# Patient Record
Sex: Female | Born: 2000 | Race: White | Hispanic: Yes | Marital: Single | Smoking: Current every day smoker
Health system: Southern US, Community
[De-identification: ages and names within clinical notes are randomized; demographics above are authoritative.]

## PROBLEM LIST (undated history)

## (undated) ENCOUNTER — Inpatient Hospital Stay (HOSPITAL_COMMUNITY): Payer: Medicaid Other

## (undated) DIAGNOSIS — F909 Attention-deficit hyperactivity disorder, unspecified type: Secondary | ICD-10-CM

## (undated) DIAGNOSIS — G4700 Insomnia, unspecified: Secondary | ICD-10-CM

## (undated) DIAGNOSIS — N289 Disorder of kidney and ureter, unspecified: Secondary | ICD-10-CM

## (undated) DIAGNOSIS — F39 Unspecified mood [affective] disorder: Secondary | ICD-10-CM

## (undated) DIAGNOSIS — E119 Type 2 diabetes mellitus without complications: Secondary | ICD-10-CM

## (undated) HISTORY — DX: Insomnia, unspecified: G47.00

## (undated) HISTORY — DX: Type 2 diabetes mellitus without complications: E11.9

## (undated) HISTORY — DX: Attention-deficit hyperactivity disorder, unspecified type: F90.9

## (undated) HISTORY — PX: NO PAST SURGERIES: SHX2092

## (undated) HISTORY — DX: Unspecified mood (affective) disorder: F39

---
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
## 2019-07-06 ENCOUNTER — Other Ambulatory Visit: Payer: Self-pay

## 2019-07-06 ENCOUNTER — Emergency Department
Admission: EM | Admit: 2019-07-06 | Discharge: 2019-07-06 | Disposition: A | Discharge status: Home / Self Care | Payer: Medicaid Other | Attending: Emergency Medicine | Admitting: Emergency Medicine

## 2019-07-06 ENCOUNTER — Emergency Department: Payer: Medicaid Other

## 2019-07-06 ENCOUNTER — Encounter: Payer: Self-pay | Admitting: Emergency Medicine

## 2019-07-06 DIAGNOSIS — N83202 Unspecified ovarian cyst, left side: Secondary | ICD-10-CM | POA: Insufficient documentation

## 2019-07-06 DIAGNOSIS — R1032 Left lower quadrant pain: Secondary | ICD-10-CM | POA: Diagnosis present

## 2019-07-06 DIAGNOSIS — F909 Attention-deficit hyperactivity disorder, unspecified type: Secondary | ICD-10-CM | POA: Insufficient documentation

## 2019-07-06 DIAGNOSIS — Z79899 Other long term (current) drug therapy: Secondary | ICD-10-CM | POA: Insufficient documentation

## 2019-07-06 DIAGNOSIS — F17210 Nicotine dependence, cigarettes, uncomplicated: Secondary | ICD-10-CM | POA: Insufficient documentation

## 2019-07-06 LAB — CBC
HCT: 42.5 % (ref 36.0–46.0)
Hemoglobin: 14.1 g/dL (ref 12.0–15.0)
MCH: 30.3 pg (ref 26.0–34.0)
MCHC: 33.2 g/dL (ref 30.0–36.0)
MCV: 91.4 fL (ref 80.0–100.0)
Platelets: 226 10*3/uL (ref 150–400)
RBC: 4.65 MIL/uL (ref 3.87–5.11)
RDW: 12.3 % (ref 11.5–15.5)
WBC: 6.6 10*3/uL (ref 4.0–10.5)
nRBC: 0 % (ref 0.0–0.2)

## 2019-07-06 LAB — COMPREHENSIVE METABOLIC PANEL
ALT: 24 U/L (ref 0–44)
AST: 22 U/L (ref 15–41)
Albumin: 4.5 g/dL (ref 3.5–5.0)
Alkaline Phosphatase: 51 U/L (ref 38–126)
Anion gap: 7 (ref 5–15)
BUN: 12 mg/dL (ref 6–20)
CO2: 21 mmol/L — ABNORMAL LOW (ref 22–32)
Calcium: 9.2 mg/dL (ref 8.9–10.3)
Chloride: 109 mmol/L (ref 98–111)
Creatinine, Ser: 0.63 mg/dL (ref 0.44–1.00)
GFR calc Af Amer: >60 mL/min (ref >60)
GFR calc non Af Amer: >60 mL/min (ref >60)
Glucose, Bld: 103 mg/dL — ABNORMAL HIGH (ref 70–99)
Potassium: 4 mmol/L (ref 3.5–5.1)
Sodium: 137 mmol/L (ref 135–145)
Total Bilirubin: 0.9 mg/dL (ref 0.3–1.2)
Total Protein: 6.9 g/dL (ref 6.5–8.1)

## 2019-07-06 LAB — URINALYSIS, COMPLETE (UACMP) WITH MICROSCOPIC
Bacteria, UA: NONE SEEN
Bilirubin Urine: NEGATIVE
Glucose, UA: NEGATIVE mg/dL
Hgb urine dipstick: NEGATIVE
Ketones, ur: NEGATIVE mg/dL
Nitrite: NEGATIVE
Protein, ur: NEGATIVE mg/dL
Specific Gravity, Urine: 1.021 (ref 1.005–1.030)
pH: 6 (ref 5.0–8.0)

## 2019-07-06 LAB — WET PREP, GENITAL
Clue Cells Wet Prep HPF POC: NONE SEEN
Sperm: NONE SEEN
Trich, Wet Prep: NONE SEEN
Yeast Wet Prep HPF POC: NONE SEEN

## 2019-07-06 LAB — CHLAMYDIA/NGC RT PCR (ARMC ONLY)
Chlamydia Tr: NOT DETECTED
N gonorrhoeae: NOT DETECTED

## 2019-07-06 LAB — LIPASE, BLOOD: Lipase: 37 U/L (ref 11–51)

## 2019-07-06 LAB — POCT PREGNANCY, URINE: Preg Test, Ur: NEGATIVE

## 2019-07-06 MED ORDER — SODIUM CHLORIDE 0.9% FLUSH: 3.0000 mL | Freq: Once | INTRAVENOUS | Status: DC

## 2019-07-06 MED ORDER — NAPROXEN 500 MG PO TBEC: 500.0000 mg | DELAYED_RELEASE_TABLET | Freq: Two times a day (BID) | ORAL | 0 refills | Status: AC | PRN

## 2019-07-06 MED ORDER — NAPROXEN 500 MG PO TBEC: 500.0000 mg | DELAYED_RELEASE_TABLET | Freq: Two times a day (BID) | ORAL | 0 refills | Status: DC

## 2019-07-06 MED ORDER — ONDANSETRON 4 MG PO TBDP: 4.0000 mg | ORAL_TABLET | Freq: Three times a day (TID) | ORAL | 0 refills | Status: DC | PRN

## 2019-07-06 MED ORDER — KETOROLAC TROMETHAMINE 60 MG/2ML IM SOLN
60.0000 mg | Freq: Once | INTRAMUSCULAR | Status: AC
  Administered 2019-07-06: 60 mg via INTRAMUSCULAR
  Filled 2019-07-06: qty 2

## 2019-07-06 NOTE — ED Provider Notes (Signed)
[facility] Emergency Department Provider Note  ____________________________________________   First MD Initiated Contact with Patient 07/06/19 1608     (approximate)  I have reviewed the triage vital signs and the nursing notes.   HISTORY  Chief Complaint Back Pain    HPI Natasha Hayes is a 18 y.o. female here with left lower quadrant pain.  The patient states that monthly for the last year or so, she has had sharp, severe, primarily left lower quadrant abdominal pain.  The pain is aching, sharp, and radiates up towards her back.  She states she occasionally has some nausea with it.  No vomiting.  No diarrhea.  She states that her symptoms seem to be intermittently worsening each month, around the time of her period.  She states that symptoms did seem to begin after she started Depo shots.  Denies any fevers or chills.  No diarrhea.  She is chronically constipated but this is not new.  No vaginal bleeding or discharge.  No dyspareunia.        Past Medical History:  Diagnosis Date   ADHD    Insomnia    Mood disorder (HCC)    Renal disorder    kdiney stones    There are no active problems to display for this patient.   History reviewed. No pertinent surgical history.  Prior to Admission medications   Medication Sig Start Date End Date Taking? Authorizing Provider  cetirizine (ZYRTEC) 10 MG tablet Take 10 mg by mouth at bedtime. 04/18/19   [provider]  cholecalciferol (VITAMIN D) 1000 units tablet Take 1,000 Units by mouth daily.    [provider]  fluticasone (FLONASE) 50 MCG/ACT nasal spray Place 1 spray into both nostrils daily. 04/18/19   [provider]  lamoTRIgine (LAMICTAL) 100 MG tablet Take 100 mg by mouth daily.    [provider]  lamoTRIgine (LAMICTAL) 25 MG tablet Take 25 mg by mouth daily.    [provider]  lisdexamfetamine (VYVANSE) 40 MG capsule Take 40 mg by mouth every morning.     [provider]  Melatonin 3 MG TABS Take by mouth.    [provider]  naproxen (EC NAPROSYN) 500 MG EC tablet Take 1 tablet (500 mg total) by mouth 2 (two) times daily as needed for up to 10 days. 07/06/19 07/16/19  Shaune Pollack, MD  norelgestromin-ethinyl estradiol [employer]) 150-35 MCG/24HR transdermal patch Place 1 patch onto the skin once a week. Apply 1 patch weekly for 3 weeks, then 1 week without patch 08/25/18   Copland, Alicia B, PA-C  ondansetron (ZOFRAN ODT) 4 MG disintegrating tablet Take 1 tablet (4 mg total) by mouth every 8 (eight) hours as needed for nausea or vomiting. 07/06/19   Shaune Pollack, MD  oxyCODONE-acetaminophen (PERCOCET) 5-325 MG tablet Take 1 tablet by mouth every 4 (four) hours as needed. 12/25/18 12/25/19  Darci Current, MD    Allergies Ibuprofen  Family History  Problem Relation Age of Onset   Cancer Paternal Grandfather        lung    Social History Social History   Tobacco Use   Smoking status: Current Every Day Smoker   Smokeless tobacco: Never Used  Substance Use Topics   Alcohol use: No   Drug use: Yes    Types: Marijuana    Review of Systems  Review of Systems  Constitutional: Positive for fatigue. Negative for fever.  HENT: Negative for congestion and sore throat.  Eyes: Negative for visual disturbance.  Respiratory: Negative for cough and shortness of breath.   Cardiovascular: Negative for chest pain.  Gastrointestinal: Positive for abdominal pain and nausea. Negative for diarrhea and vomiting.  Genitourinary: Positive for pelvic pain. Negative for flank pain.  Musculoskeletal: Negative for back pain and neck pain.  Skin: Negative for rash and wound.  Neurological: Negative for weakness.  All other systems reviewed and are negative.    ____________________________________________  PHYSICAL EXAM:      VITAL SIGNS: ED Triage Vitals  Enc Vitals Group     BP 07/06/19 1410 118/70     Pulse Rate  07/06/19 1410 71     Resp 07/06/19 1410 17     Temp 07/06/19 1410 99.2 F (37.3 C)     Temp Source 07/06/19 1410 Oral     SpO2 07/06/19 1410 97 %     Weight 07/06/19 1408 139 lb 15.9 oz (63.5 kg)     Height 07/06/19 1411 5\' 3"  (1.6 m)     Head Circumference --      Peak Flow --      Pain Score 07/06/19 1406 7     Pain Loc --      Pain Edu? --      Excl. in GC? --      Physical Exam Vitals signs and nursing note reviewed.  Constitutional:      General: She is not in acute distress.    Appearance: She is well-developed.  HENT:     Head: Normocephalic and atraumatic.  Eyes:     Conjunctiva/sclera: Conjunctivae normal.  Neck:     Musculoskeletal: Neck supple.  Cardiovascular:     Rate and Rhythm: Normal rate and regular rhythm.     Heart sounds: Normal heart sounds. No murmur. No friction rub.  Pulmonary:     Effort: Pulmonary effort is normal. No respiratory distress.     Breath sounds: Normal breath sounds. No wheezing or rales.  Abdominal:     General: There is no distension.     Palpations: Abdomen is soft.     Tenderness: There is no abdominal tenderness.     Comments: Very minimal left lower quadrant and left adnexal pain.  No distention.  No guarding.  No rebound.  Genitourinary:    Comments: Small volume vaginal discharge with no cervical erythema or cervical motion tenderness.  No adnexal pain or fullness. Skin:    General: Skin is warm.     Capillary Refill: Capillary refill takes less than 2 seconds.  Neurological:     Mental Status: She is alert and oriented to person, place, and time.     Motor: No abnormal muscle tone.       ____________________________________________   LABS (all labs ordered are listed, but only abnormal results are displayed)  Labs Reviewed  WET PREP, GENITAL - Abnormal; Notable for the following components:      Result Value   WBC, Wet Prep HPF POC FEW (*)    All other components within normal limits  COMPREHENSIVE METABOLIC  PANEL - Abnormal; Notable for the following components:   CO2 21 (*)    Glucose, Bld 103 (*)    All other components within normal limits  URINALYSIS, COMPLETE (UACMP) WITH MICROSCOPIC - Abnormal; Notable for the following components:   Color, Urine YELLOW (*)    APPearance HAZY (*)    Leukocytes,Ua SMALL (*)    All other components within normal limits  CHLAMYDIA/NGC RT PCR (  [facility] ONLY)  LIPASE, BLOOD  CBC  POC URINE PREG, ED  POCT PREGNANCY, URINE    ____________________________________________  EKG: None ________________________________________  RADIOLOGY All imaging, including plain films, CT scans, and ultrasounds, independently reviewed by me, and interpretations confirmed via formal radiology reads.  ED MD interpretation:   Ultrasound: Left functional cyst noted, no torsion.  Renal ultrasound negative  Official radiology report(s): US Transvaginal Non-ob  Result Date: 07/06/2019 CLINICAL DATA:  Left lower quadrant pain, pelvic pain EXAM: TRANSABDOMINAL AND TRANSVAGINAL ULTRASOUND OF PELVIS DOPPLER ULTRASOUND OF OVARIES TECHNIQUE: Both transabdominal and transvaginal ultrasound examinations of the pelvis were performed. Transabdominal technique was performed for global imaging of the pelvis including uterus, ovaries, adnexal regions, and pelvic cul-de-sac. It was necessary to proceed with endovaginal exam following the transabdominal exam to visualize the endometrium and ovaries. Color and duplex Doppler ultrasound was utilized to evaluate blood flow to the ovaries. COMPARISON:  None. FINDINGS: Uterus Measurements: 7.8 x 3.5 x 5.0 cm = volume: 71 mL. No fibroids or other mass visualized. Endometrium Thickness: 8 mm.  No focal abnormality visualized. Right ovary Measurements: 1.7 x 1.6 x 1.8 cm = volume: 3 mL. Normal appearance/no adnexal mass. Left ovary Measurements: 2.0 x 1.8 x 1.9 cm = volume: 4 mL. Normal appearance/no adnexal mass. 1.0 cm cyst or follicle. Pulsed  Doppler evaluation of both ovaries demonstrates normal low-resistance arterial and venous waveforms. Other findings No abnormal free fluid. IMPRESSION: No ultrasound abnormality of the pelvis to explain left lower quadrant or pelvic pain. There is a 1.0 cm functional cyst or follicle of the left ovary. Electronically Signed   By: Eddie Candle M.D.   On: 07/06/2019 18:22   US Pelvis Complete  Result Date: 07/06/2019 CLINICAL DATA:  Left lower quadrant pain, pelvic pain EXAM: TRANSABDOMINAL AND TRANSVAGINAL ULTRASOUND OF PELVIS DOPPLER ULTRASOUND OF OVARIES TECHNIQUE: Both transabdominal and transvaginal ultrasound examinations of the pelvis were performed. Transabdominal technique was performed for global imaging of the pelvis including uterus, ovaries, adnexal regions, and pelvic cul-de-sac. It was necessary to proceed with endovaginal exam following the transabdominal exam to visualize the endometrium and ovaries. Color and duplex Doppler ultrasound was utilized to evaluate blood flow to the ovaries. COMPARISON:  None. FINDINGS: Uterus Measurements: 7.8 x 3.5 x 5.0 cm = volume: 71 mL. No fibroids or other mass visualized. Endometrium Thickness: 8 mm.  No focal abnormality visualized. Right ovary Measurements: 1.7 x 1.6 x 1.8 cm = volume: 3 mL. Normal appearance/no adnexal mass. Left ovary Measurements: 2.0 x 1.8 x 1.9 cm = volume: 4 mL. Normal appearance/no adnexal mass. 1.0 cm cyst or follicle. Pulsed Doppler evaluation of both ovaries demonstrates normal low-resistance arterial and venous waveforms. Other findings No abnormal free fluid. IMPRESSION: No ultrasound abnormality of the pelvis to explain left lower quadrant or pelvic pain. There is a 1.0 cm functional cyst or follicle of the left ovary. Electronically Signed   By: Eddie Candle M.D.   On: 07/06/2019 18:22   US Renal  Result Date: 07/06/2019 CLINICAL DATA:  Left flank and left lower quadrant pain. EXAM: RENAL / URINARY TRACT ULTRASOUND COMPLETE  COMPARISON:  CT, 12/25/2018. FINDINGS: Right Kidney: Renal measurements: 10.9 x 4.3 x 5.0 cm = volume: 123 mL . Echogenicity within normal limits. No mass or hydronephrosis visualized. Left Kidney: Renal measurements: 10.2 x 5.6 x 4.8 cm = volume: 146 mL. Echogenicity within normal limits. No mass or hydronephrosis visualized. Bladder: Appears normal for degree of bladder distention. IMPRESSION: Normal renal ultrasound.  No findings to account for the patient's symptoms. Electronically Signed   By: Amie Portlandavid  Ormond M.D.   On: 07/06/2019 18:24   [place] Art/ven Flow Abd Pelv Doppler  Result Date: 07/06/2019 CLINICAL DATA:  Left lower quadrant pain, pelvic pain EXAM: TRANSABDOMINAL AND TRANSVAGINAL ULTRASOUND OF PELVIS DOPPLER ULTRASOUND OF OVARIES TECHNIQUE: Both transabdominal and transvaginal ultrasound examinations of the pelvis were performed. Transabdominal technique was performed for global imaging of the pelvis including uterus, ovaries, adnexal regions, and pelvic cul-de-sac. It was necessary to proceed with endovaginal exam following the transabdominal exam to visualize the endometrium and ovaries. Color and duplex Doppler ultrasound was utilized to evaluate blood flow to the ovaries. COMPARISON:  None. FINDINGS: Uterus Measurements: 7.8 x 3.5 x 5.0 cm = volume: 71 mL. No fibroids or other mass visualized. Endometrium Thickness: 8 mm.  No focal abnormality visualized. Right ovary Measurements: 1.7 x 1.6 x 1.8 cm = volume: 3 mL. Normal appearance/no adnexal mass. Left ovary Measurements: 2.0 x 1.8 x 1.9 cm = volume: 4 mL. Normal appearance/no adnexal mass. 1.0 cm cyst or follicle. Pulsed Doppler evaluation of both ovaries demonstrates normal low-resistance arterial and venous waveforms. Other findings No abnormal free fluid. IMPRESSION: No ultrasound abnormality of the pelvis to explain left lower quadrant or pelvic pain. There is a 1.0 cm functional cyst or follicle of the left ovary. Electronically Signed   By:  Lauralyn PrimesAlex  Bibbey M.D.   On: 07/06/2019 18:22    ____________________________________________  PROCEDURES   Procedure(s) performed (including Critical Care):  Procedures  ____________________________________________  INITIAL IMPRESSION / MDM / ASSESSMENT AND PLAN / ED COURSE  As part of my medical decision making, I reviewed the following data within the electronic MEDICAL RECORD NUMBER Notes from prior ED visits and [place] Controlled Substance Database      *Natasha Hayes was evaluated in Emergency Department on 07/06/2019 for the symptoms described in the history of present illness. She was evaluated in the context of the global COVID-19 pandemic, which necessitated consideration that the patient might be at risk for infection with the SARS-CoV-2 virus that causes COVID-19. Institutional protocols and algorithms that pertain to the evaluation of patients at risk for COVID-19 are in a state of rapid change based on information released by regulatory bodies including the CDC and federal and state organizations. These policies and algorithms were followed during the patient's care in the ED.  Some ED evaluations and interventions may be delayed as a result of limited staffing during the pandemic.*      Medical Decision Making: 18 year old female here with intermittent left lower quadrant pain.  This seems to be monthly and correlates with her cycle and began after starting Depot. Suspect dysmenorrhea versus intermittent pain from ovarian cysts. No signs of torsion. UA w/o UTI and pain is not consistent with pyelo. U/S shows no hydro, and she has no hematuria or signs of stone.  Otherwise, white blood cell count is normal, LFTs normal, and she has no overt abdominal tenderness to suggest diverticulitis, colitis, appendicitis, or intra-abdominal pathology.  She does have a small cyst noted on ultrasound but no evidence of torsion as mentioned.  Will start on NSAIDs, antiemetics, and [facility] as she may  benefit from alternative birth control methods.  ____________________________________________  FINAL CLINICAL IMPRESSION(S) / ED DIAGNOSES  Final diagnoses:  Cyst of left ovary     MEDICATIONS GIVEN DURING THIS VISIT:  Medications  sodium chloride flush (NS) 0.9 % injection 3 mL (has no administration  in time range)  ketorolac (TORADOL) injection 60 mg (60 mg Intramuscular Given 07/06/19 1849)     ED Discharge Orders         Ordered    naproxen (EC NAPROSYN) 500 MG EC tablet  2 times daily with meals,   Status:  Discontinued     07/06/19 1934    ondansetron (ZOFRAN ODT) 4 MG disintegrating tablet  Every 8 hours PRN,   Status:  Discontinued     07/06/19 1934    naproxen (EC NAPROSYN) 500 MG EC tablet  2 times daily PRN     07/06/19 1935    ondansetron (ZOFRAN ODT) 4 MG disintegrating tablet  Every 8 hours PRN     07/06/19 1935           Note:  This document was prepared using Dragon voice recognition software and may include unintentional dictation errors.   Shaune PollackIsaacs, Pranish Akhavan, MD 07/06/19 2059

## 2019-07-06 NOTE — ED Notes (Signed)
ED Provider at bedside. 

## 2019-07-06 NOTE — ED Notes (Signed)
Pt reports hx of kidney infections, states pain feels the same today. States "last time I was here, the doc told me if I got one again, they'd have to go inside and blow my kidney up".

## 2019-07-06 NOTE — ED Triage Notes (Signed)
C/O back pain x 2-3 days.  C/O to low back.  Also c/o several episodes of emesis.  Patient is AAOx3.  Skin warm and dry. NAD

## 2019-07-06 NOTE — ED Notes (Signed)
Pt to US.

## 2019-08-22 ENCOUNTER — Emergency Department
Admission: EM | Admit: 2019-08-22 | Discharge: 2019-08-23 | Disposition: A | Discharge status: Home / Self Care | Payer: Medicaid Other | Attending: Emergency Medicine | Admitting: Emergency Medicine

## 2019-08-22 ENCOUNTER — Other Ambulatory Visit: Payer: Self-pay

## 2019-08-22 DIAGNOSIS — F17210 Nicotine dependence, cigarettes, uncomplicated: Secondary | ICD-10-CM | POA: Diagnosis not present

## 2019-08-22 DIAGNOSIS — Z79899 Other long term (current) drug therapy: Secondary | ICD-10-CM | POA: Insufficient documentation

## 2019-08-22 DIAGNOSIS — F909 Attention-deficit hyperactivity disorder, unspecified type: Secondary | ICD-10-CM | POA: Insufficient documentation

## 2019-08-22 DIAGNOSIS — R112 Nausea with vomiting, unspecified: Secondary | ICD-10-CM

## 2019-08-22 DIAGNOSIS — R1010 Upper abdominal pain, unspecified: Secondary | ICD-10-CM

## 2019-08-22 DIAGNOSIS — K2900 Acute gastritis without bleeding: Secondary | ICD-10-CM

## 2019-08-22 DIAGNOSIS — R109 Unspecified abdominal pain: Secondary | ICD-10-CM | POA: Diagnosis present

## 2019-08-22 DIAGNOSIS — R1011 Right upper quadrant pain: Secondary | ICD-10-CM | POA: Insufficient documentation

## 2019-08-22 LAB — POCT PREGNANCY, URINE: Preg Test, Ur: NEGATIVE

## 2019-08-22 LAB — CBC
HCT: 43.7 % (ref 36.0–46.0)
Hemoglobin: 14.5 g/dL (ref 12.0–15.0)
MCH: 29.7 pg (ref 26.0–34.0)
MCHC: 33.2 g/dL (ref 30.0–36.0)
MCV: 89.4 fL (ref 80.0–100.0)
Platelets: 223 10*3/uL (ref 150–400)
RBC: 4.89 MIL/uL (ref 3.87–5.11)
RDW: 12 % (ref 11.5–15.5)
WBC: 7.5 10*3/uL (ref 4.0–10.5)
nRBC: 0 % (ref 0.0–0.2)

## 2019-08-22 LAB — BASIC METABOLIC PANEL
Anion gap: 9 (ref 5–15)
BUN: 13 mg/dL (ref 6–20)
CO2: 23 mmol/L (ref 22–32)
Calcium: 9.5 mg/dL (ref 8.9–10.3)
Chloride: 108 mmol/L (ref 98–111)
Creatinine, Ser: 0.71 mg/dL (ref 0.44–1.00)
GFR calc Af Amer: >60 mL/min (ref >60)
GFR calc non Af Amer: >60 mL/min (ref >60)
Glucose, Bld: 96 mg/dL (ref 70–99)
Potassium: 4.2 mmol/L (ref 3.5–5.1)
Sodium: 140 mmol/L (ref 135–145)

## 2019-08-22 LAB — URINALYSIS, COMPLETE (UACMP) WITH MICROSCOPIC
Bacteria, UA: NONE SEEN
Bilirubin Urine: NEGATIVE
Glucose, UA: NEGATIVE mg/dL
Hgb urine dipstick: NEGATIVE
Ketones, ur: NEGATIVE mg/dL
Leukocytes,Ua: NEGATIVE
Nitrite: NEGATIVE
Protein, ur: NEGATIVE mg/dL
Specific Gravity, Urine: 1.018 (ref 1.005–1.030)
pH: 7 (ref 5.0–8.0)

## 2019-08-22 MED ORDER — FENTANYL CITRATE (PF) 100 MCG/2ML IJ SOLN
50.0000 ug | Freq: Once | INTRAMUSCULAR | Status: AC
  Administered 2019-08-22: 50 ug via INTRAVENOUS
  Filled 2019-08-22: qty 2

## 2019-08-22 MED ORDER — FAMOTIDINE IN NACL 20-0.9 MG/50ML-% IV SOLN
20.0000 mg | Freq: Once | INTRAVENOUS | Status: AC
  Administered 2019-08-22: 20 mg via INTRAVENOUS
  Filled 2019-08-22: qty 50

## 2019-08-22 MED ORDER — SODIUM CHLORIDE 0.9 % IV BOLUS
1000.0000 mL | Freq: Once | INTRAVENOUS | Status: AC
  Administered 2019-08-22: 1000 mL via INTRAVENOUS

## 2019-08-22 MED ORDER — ONDANSETRON HCL 4 MG/2ML IJ SOLN
4.0000 mg | Freq: Once | INTRAMUSCULAR | Status: AC
  Administered 2019-08-22: 4 mg via INTRAVENOUS
  Filled 2019-08-22: qty 2

## 2019-08-22 NOTE — ED Triage Notes (Signed)
First RN Note: Pt presents to ED via POV with c/o RUQ pain after eating something greasy. Pt states RUQ/R flank pain. Pt also c/o nausea at this time.

## 2019-08-22 NOTE — ED Notes (Signed)
Pt came to desk irate, asking about wait times, this RN explained that cannot give wait times out. Pt states that she will call her mother to take her to [facility].

## 2019-08-22 NOTE — ED Provider Notes (Signed)
[facility] Emergency Department Provider Note   ____________________________________________   First MD Initiated Contact with Patient 08/22/19 2317     (approximate)  I have reviewed the triage vital signs and the nursing notes.   HISTORY  Chief Complaint Flank Pain    HPI Natasha Hayes is a 18 y.o. female who presents to the ED from home with a chief complaint of abdominal pain.  Patient reports a 3-day history of right upper quadrant abdominal pain radiating to her right flank.  Exacerbated by eating greasy foods.  Patient ate a plate of heavy and greasy foods tonight and vomited.  Has vomited a total of 3 times in the last 3 days.  Denies fever, cough, chest pain, shortness of breath, hematuria, dysuria, diarrhea.  Denies recent travel or trauma.       Past Medical History:  Diagnosis Date  . ADHD   . Insomnia   . Mood disorder (HCC)   . Renal disorder    kdiney stones    There are no active problems to display for this patient.   No past surgical history on file.  Prior to Admission medications   Medication Sig Start Date End Date Taking? Authorizing Provider  cetirizine (ZYRTEC) 10 MG tablet Take 10 mg by mouth at bedtime. 04/18/19   [provider]  cholecalciferol (VITAMIN D) 1000 units tablet Take 1,000 Units by mouth daily.    [provider]  fluticasone (FLONASE) 50 MCG/ACT nasal spray Place 1 spray into both nostrils daily. 04/18/19   [provider]  lamoTRIgine (LAMICTAL) 100 MG tablet Take 100 mg by mouth daily.    [provider]  lamoTRIgine (LAMICTAL) 25 MG tablet Take 25 mg by mouth daily.    [provider]  lisdexamfetamine (VYVANSE) 40 MG capsule Take 40 mg by mouth every morning.    [provider]  Melatonin 3 MG TABS Take by mouth.    [provider]  norelgestromin-ethinyl estradiol Burr Medico(XULANE) 150-35 MCG/24HR transdermal patch Place 1 patch onto the skin once  a week. Apply 1 patch weekly for 3 weeks, then 1 week without patch 08/25/18   Copland, Alicia B, PA-C  ondansetron (ZOFRAN ODT) 4 MG disintegrating tablet Take 1 tablet (4 mg total) by mouth every 8 (eight) hours as needed for nausea or vomiting. 07/06/19   Shaune PollackIsaacs, Cameron, MD  oxyCODONE-acetaminophen (PERCOCET) 5-325 MG tablet Take 1 tablet by mouth every 4 (four) hours as needed. 12/25/18 12/25/19  Darci CurrentBrown, Muniz N, MD    Allergies Patient has no known allergies.  Family History  Problem Relation Age of Onset  . Cancer Paternal Grandfather        lung    Social History Social History   Tobacco Use  . Smoking status: Current Every Day Smoker  . Smokeless tobacco: Never Used  Substance Use Topics  . Alcohol use: No  . Drug use: Yes    Types: Marijuana    Review of Systems  Constitutional: No fever/chills Eyes: No visual changes. ENT: No sore throat. Cardiovascular: Denies chest pain. Respiratory: Denies shortness of breath. Gastrointestinal: Positive for abdominal pain, nausea and vomiting.  No diarrhea.  No constipation. Genitourinary: Negative for dysuria. Musculoskeletal: Negative for back pain. Skin: Negative for rash. Neurological: Negative for headaches, focal weakness or numbness.   ____________________________________________   PHYSICAL EXAM:  VITAL SIGNS: ED Triage Vitals  Enc Vitals Group     BP 08/22/19 1953 132/87     Pulse Rate 08/22/19  1953 78     Resp 08/22/19 1953 20     Temp 08/22/19 1953 98.8 F (37.1 C)     Temp Source 08/22/19 1953 Oral     SpO2 08/22/19 1953 99 %     Weight 08/22/19 1954 175 lb (79.4 kg)     Height 08/22/19 1954 5\' 3"  (1.6 m)     Head Circumference --      Peak Flow --      Pain Score 08/22/19 1954 8     Pain Loc --      Pain Edu? --      Excl. in [place]? --     Constitutional: Alert and oriented. Well appearing and in mild acute distress. Eyes: Conjunctivae are normal. PERRL. EOMI. Head: Atraumatic. Nose: No  congestion/rhinnorhea. Mouth/Throat: Mucous membranes are moist.  Oropharynx non-erythematous. Neck: No stridor.   Cardiovascular: Normal rate, regular rhythm. Grossly normal heart sounds.  Good peripheral circulation. Respiratory: Normal respiratory effort.  No retractions. Lungs CTAB. Gastrointestinal: Soft and mild to moderate tenderness to epigastrium and right upper quadrant without rebound or guarding. No distention. No abdominal bruits. No CVA tenderness. Musculoskeletal: No lower extremity tenderness nor edema.  No joint effusions. Neurologic:  Normal speech and language. No gross focal neurologic deficits are appreciated. No gait instability. Skin:  Skin is warm, dry and intact. No rash noted. Psychiatric: Mood and affect are normal. Speech and behavior are normal.  ____________________________________________   LABS (all labs ordered are listed, but only abnormal results are displayed)  Labs Reviewed  URINALYSIS, COMPLETE (UACMP) WITH MICROSCOPIC - Abnormal; Notable for the following components:      Result Value   Color, Urine YELLOW (*)    APPearance CLOUDY (*)    All other components within normal limits  BASIC METABOLIC PANEL  CBC  HEPATIC FUNCTION PANEL  LIPASE, BLOOD  POC URINE PREG, ED  POCT PREGNANCY, URINE   ____________________________________________  EKG  None ____________________________________________  RADIOLOGY  ED MD interpretation: Unremarkable right upper quadrant abdominal ultrasound; unremarkable CT scan  Official radiology report(s): Ct Abdomen Pelvis W Contrast  Result Date: 08/23/2019 CLINICAL DATA:  18 year old female with right upper quadrant abdominal pain. EXAM: CT ABDOMEN AND PELVIS WITH CONTRAST TECHNIQUE: Multidetector CT imaging of the abdomen and pelvis was performed using the standard protocol following bolus administration of intravenous contrast. CONTRAST:  155mL OMNIPAQUE IOHEXOL 300 MG/ML  SOLN COMPARISON:  CT of the abdomen  pelvis dated 12/25/2018 FINDINGS: Lower chest: The visualized lung bases are clear. No intra-abdominal free air or free fluid. Hepatobiliary: There is fatty infiltration of the liver. No intrahepatic biliary duct dilatation. The gallbladder is unremarkable. Pancreas: Unremarkable. No pancreatic ductal dilatation or surrounding inflammatory changes. Spleen: Normal in size without focal abnormality. Adrenals/Urinary Tract: Adrenal glands are unremarkable. Kidneys are normal, without renal calculi, focal lesion, or hydronephrosis. Bladder is unremarkable. Stomach/Bowel: Stomach is within normal limits. Appendix appears normal. No evidence of bowel wall thickening, distention, or inflammatory changes. Vascular/Lymphatic: No significant vascular findings are present. No enlarged abdominal or pelvic lymph nodes. Reproductive: The uterus and ovaries are grossly unremarkable. Other: None Musculoskeletal: No acute or significant osseous findings. IMPRESSION: 1. No acute intra-abdominal or pelvic pathology. 2. Fatty liver. Electronically Signed   By: Anner Crete M.D.   On: 08/23/2019 02:38   US Abdomen Limited Ruq  Result Date: 08/23/2019 CLINICAL DATA:  18 year old female with right upper quadrant abdominal pain. EXAM: ULTRASOUND ABDOMEN LIMITED RIGHT UPPER QUADRANT COMPARISON:  CT of the abdomen  pelvis dated 12/25/2018 FINDINGS: Gallbladder: No gallstones or wall thickening visualized. No sonographic Murphy sign noted by sonographer. Common bile duct: Diameter: 4 mm Liver: There is diffuse increased liver echogenicity most commonly seen with fatty infiltration. Superimposed inflammation or fibrosis is not excluded. Portal vein is patent on color Doppler imaging with normal direction of blood flow towards the liver. Other: None. IMPRESSION: Fatty liver, otherwise unremarkable right upper quadrant ultrasound. Electronically Signed   By: Elgie CollardArash  Radparvar M.D.   On: 08/23/2019 01:19     ____________________________________________   PROCEDURES  Procedure(s) performed (including Critical Care):  Procedures   ____________________________________________   INITIAL IMPRESSION / ASSESSMENT AND PLAN / ED COURSE  As part of my medical decision making, I reviewed the following data within the electronic MEDICAL RECORD NUMBER Nursing notes reviewed and incorporated, Labs reviewed, Old chart reviewed, Radiograph reviewed and Notes from prior ED visits     Natasha BenderHaley Alberico was evaluated in Emergency Department on 08/23/2019 for the symptoms described in the history of present illness. She was evaluated in the context of the global COVID-19 pandemic, which necessitated consideration that the patient might be at risk for infection with the SARS-CoV-2 virus that causes COVID-19. Institutional protocols and algorithms that pertain to the evaluation of patients at risk for COVID-19 are in a state of rapid change based on information released by regulatory bodies including the CDC and federal and state organizations. These policies and algorithms were followed during the patient's care in the ED.   18 year old female who presents with a 3-day history of upper abdominal pain associated with vomiting. Differential diagnosis includes, but is not limited to, biliary disease (biliary colic, acute cholecystitis, cholangitis, choledocholithiasis, etc), intrathoracic causes for epigastric abdominal pain including ACS, gastritis, duodenitis, pancreatitis, small bowel or large bowel obstruction, abdominal aortic aneurysm, hernia, and ulcer(s).  Patient is afebrile with normal WBC.  Will check LFTs, lipase.  Proceed with right upper quadrant abdominal ultrasound to evaluate cholecystitis.  Administer IV fluids, Fentanyl for pain, Zofran for nausea.  Clinical Course as of Aug 23 247  Tue Aug 23, 2019  0146 Updated patient on ultrasound and lab results.  Patient is hurting again and tearful.  Will re-dose IV  analgesia and proceed with CT abdomen/pelvis.   [JS]  0246 Patient soundly asleep.  Awakened patient to discuss unremarkable CT result.  Will discharge home on Pepcid, Bentyl and Zofran.  Strict return precautions given.  Patient verbalizes understanding agrees with plan of care.   [JS]    Clinical Course User Index [JS] Irean HongSung, Jade J, MD     ____________________________________________   FINAL CLINICAL IMPRESSION(S) / ED DIAGNOSES  Final diagnoses:  Pain of upper abdomen  Acute gastritis without hemorrhage, unspecified gastritis type  Non-intractable vomiting with nausea, unspecified vomiting type     ED Discharge Orders    None       Note:  This document was prepared using Dragon voice recognition software and may include unintentional dictation errors.   Irean HongSung, Jade J, MD 08/23/19 [phone]

## 2019-08-22 NOTE — ED Triage Notes (Signed)
Patient to ER for c/o pain to right flank and RLQ. Patient states she vomited today after eating something greasy. Patient has vomited x3 total in last 2-3 days. Denies seeing any blood in urine or having any dysuria.

## 2019-08-23 ENCOUNTER — Emergency Department: Payer: Medicaid Other

## 2019-08-23 ENCOUNTER — Encounter: Payer: Self-pay | Admitting: Radiology

## 2019-08-23 LAB — HEPATIC FUNCTION PANEL
ALT: 44 U/L (ref 0–44)
AST: 28 U/L (ref 15–41)
Albumin: 4.6 g/dL (ref 3.5–5.0)
Alkaline Phosphatase: 53 U/L (ref 38–126)
Bilirubin, Direct: <0.1 mg/dL (ref 0.0–0.2)
Indirect Bilirubin: UNDETERMINED mg/dL (ref 0.3–0.9)
Total Bilirubin: 0.5 mg/dL (ref 0.3–1.2)
Total Protein: 7.1 g/dL (ref 6.5–8.1)

## 2019-08-23 LAB — LIPASE, BLOOD: Lipase: 24 U/L (ref 11–51)

## 2019-08-23 MED ORDER — MORPHINE SULFATE (PF) 4 MG/ML IV SOLN
4.0000 mg | Freq: Once | INTRAVENOUS | Status: AC
  Administered 2019-08-23: 02:00:00 4 mg via INTRAVENOUS
  Filled 2019-08-23: qty 1

## 2019-08-23 MED ORDER — SODIUM CHLORIDE 0.9 % IV BOLUS
1000.0000 mL | Freq: Once | INTRAVENOUS | Status: AC
  Administered 2019-08-23: 1000 mL via INTRAVENOUS

## 2019-08-23 MED ORDER — ONDANSETRON HCL 4 MG/2ML IJ SOLN
4.0000 mg | Freq: Once | INTRAMUSCULAR | Status: AC
  Administered 2019-08-23: 02:00:00 4 mg via INTRAVENOUS
  Filled 2019-08-23: qty 2

## 2019-08-23 MED ORDER — IOHEXOL 300 MG/ML  SOLN
100.0000 mL | Freq: Once | INTRAMUSCULAR | Status: AC | PRN
  Administered 2019-08-23: 02:00:00 100 mL via INTRAVENOUS

## 2019-08-23 MED ORDER — DICYCLOMINE HCL 20 MG PO TABS: 20.0000 mg | ORAL_TABLET | Freq: Four times a day (QID) | ORAL | 0 refills | Status: DC | PRN

## 2019-08-23 MED ORDER — FAMOTIDINE 20 MG PO TABS: 20.0000 mg | ORAL_TABLET | Freq: Two times a day (BID) | ORAL | 0 refills | Status: DC

## 2019-08-23 MED ORDER — ONDANSETRON 4 MG PO TBDP: 4.0000 mg | ORAL_TABLET | Freq: Three times a day (TID) | ORAL | 0 refills | Status: DC | PRN

## 2019-08-23 MED ORDER — IOHEXOL 240 MG/ML SOLN
50.0000 mL | Freq: Once | INTRAMUSCULAR | Status: AC
  Administered 2019-08-23: 02:00:00 50 mL via ORAL

## 2019-08-23 NOTE — ED Notes (Signed)
Pt assisted to bathroom at this time. Pt with steady gait. Peri-care performed independently. Pt assisted back to bed. This RN will continue to monitor.

## 2019-08-23 NOTE — Discharge Instructions (Signed)
1.  Start Pepcid 20 mg twice daily. 2.  You may take medicines as needed for pain and nausea (Bentyl/Zofran #20). 3.  Bland diet x3 days, then slowly advance diet as tolerated. 4.  Return to the ER for worsening symptoms, persistent vomiting, difficulty breathing or other concerns.

## 2019-10-08 ENCOUNTER — Emergency Department
Admission: EM | Admit: 2019-10-08 | Discharge: 2019-10-08 | Disposition: A | Discharge status: Home / Self Care | Payer: Medicaid Other | Attending: Emergency Medicine | Admitting: Emergency Medicine

## 2019-10-08 DIAGNOSIS — Z79899 Other long term (current) drug therapy: Secondary | ICD-10-CM | POA: Insufficient documentation

## 2019-10-08 DIAGNOSIS — F17210 Nicotine dependence, cigarettes, uncomplicated: Secondary | ICD-10-CM | POA: Diagnosis not present

## 2019-10-08 DIAGNOSIS — L249 Irritant contact dermatitis, unspecified cause: Secondary | ICD-10-CM | POA: Insufficient documentation

## 2019-10-08 DIAGNOSIS — R21 Rash and other nonspecific skin eruption: Secondary | ICD-10-CM | POA: Diagnosis present

## 2019-10-08 MED ORDER — HYDROCORTISONE 1 % EX OINT
TOPICAL_OINTMENT | Freq: Two times a day (BID) | CUTANEOUS | Status: DC
  Administered 2019-10-08: 07:00:00 via TOPICAL
  Filled 2019-10-08: qty 28.35

## 2019-10-08 MED ORDER — DIPHENHYDRAMINE HCL 25 MG PO CAPS
50.0000 mg | ORAL_CAPSULE | Freq: Once | ORAL | Status: AC
  Administered 2019-10-08: 50 mg via ORAL
  Filled 2019-10-08: qty 2

## 2019-10-08 NOTE — ED Provider Notes (Signed)
[facility] Emergency Department Provider Note  ____________________________________________   First MD Initiated Contact with Patient 10/08/19 [phone]     (approximate)  I have reviewed the triage vital signs and the nursing notes.   HISTORY  Chief Complaint Rash    HPI Natasha Hayes is a 18 y.o. female with no contributory past medical history who presents for evaluation of acute onset moderate rash to the insides of both of her arms.  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  She reports that it is itching and burning.  It is only present in the middle inside part of both of her arms that seems to be starting around the inner elbow and radiating outwards.  The palms of her hands are not involved and she has no rash on her torso, neck, or face.  No lesions in her mouth.  She has no allergies of which she is aware and the detergent used at work is the same that she always uses.  She showed her manager who told her she had to stop working and come to the emergency department.   She denies shortness of breath, nausea, vomiting, abdominal pain, and diarrhea.        Past Medical History:  Diagnosis Date  . ADHD   . Insomnia   . Mood disorder (HCC)   . Renal disorder    kdiney stones    There are no active problems to display for this patient.   History reviewed. No pertinent surgical history.  Prior to Admission medications   Medication Sig Start Date End Date Taking? Authorizing Provider  cetirizine (ZYRTEC) 10 MG tablet Take 10 mg by mouth at bedtime. 04/18/19   [provider]  cholecalciferol (VITAMIN D) 1000 units tablet Take 1,000 Units by mouth daily.    [provider]  dicyclomine (BENTYL) 20 MG tablet Take 1 tablet (20 mg total) by mouth every 6 (six) hours as needed. 08/23/19   Irean HongSung, Jade J, MD  famotidine (PEPCID) 20 MG tablet Take 1 tablet (20 mg total) by mouth 2 (two) times daily. 08/23/19   Irean HongSung,  Jade J, MD  fluticasone (FLONASE) 50 MCG/ACT nasal spray Place 1 spray into both nostrils daily. 04/18/19   [provider]  lamoTRIgine (LAMICTAL) 100 MG tablet Take 100 mg by mouth daily.    [provider]  lamoTRIgine (LAMICTAL) 25 MG tablet Take 25 mg by mouth daily.    [provider]  lisdexamfetamine (VYVANSE) 40 MG capsule Take 40 mg by mouth every morning.    [provider]  Melatonin 3 MG TABS Take by mouth.    [provider]  norelgestromin-ethinyl estradiol Burr Medico(XULANE) 150-35 MCG/24HR transdermal patch Place 1 patch onto the skin once a week. Apply 1 patch weekly for 3 weeks, then 1 week without patch 08/25/18   Copland, Alicia B, PA-C  ondansetron (ZOFRAN ODT) 4 MG disintegrating tablet Take 1 tablet (4 mg total) by mouth every 8 (eight) hours as needed for nausea or vomiting. 08/23/19   Irean HongSung, Jade J, MD  oxyCODONE-acetaminophen (PERCOCET) 5-325 MG tablet Take 1 tablet by mouth every 4 (four) hours as needed. 12/25/18 12/25/19  Darci CurrentBrown, [place] N, MD    Allergies Patient has no known allergies.  Family History  Problem Relation Age of Onset  . Cancer Paternal Grandfather        lung    Social History Social History   Tobacco Use  . Smoking status:  Current Every Day Smoker  . Smokeless tobacco: Never Used  Substance Use Topics  . Alcohol use: No  . Drug use: Yes    Types: Marijuana    Review of Systems Constitutional: No fever/chills ENT: No sore throat. Cardiovascular: Denies chest pain. Respiratory: Denies shortness of breath. Gastrointestinal: No abdominal pain.  No nausea, no vomiting.  No diarrhea.  Musculoskeletal: Negative for neck pain.  Negative for back pain. Integumentary: Itching and burning rash on the insides of both of her arms. Neurological: Negative for headaches, focal weakness or numbness.   ____________________________________________   PHYSICAL EXAM:  VITAL SIGNS: ED Triage Vitals  Enc Vitals  Group     BP 10/08/19 0609 (!) 155/121     Pulse Rate 10/08/19 0609 (!) 40     Resp 10/08/19 0609 20     Temp 10/08/19 0609 98.3 F (36.8 C)     Temp Source 10/08/19 0609 Oral     SpO2 10/08/19 0609 96 %     Weight 10/08/19 0605 81.6 kg (180 lb)     Height 10/08/19 0605 1.6 m (5\' 3" )     Head Circumference --      Peak Flow --      Pain Score 10/08/19 0605 7     Pain Loc --      Pain Edu? --      Excl. in [place]? --     Constitutional: Alert and oriented.  No acute distress. Eyes: Conjunctivae are normal.  Head: Atraumatic. Mouth/Throat: Mucous membranes are moist.  No lesions inside her mouth, no mucosal involvement. Neck: No stridor.  No meningeal signs.   Cardiovascular: Normal rate, regular rhythm. Good peripheral circulation. Grossly normal heart sounds. Respiratory: Normal respiratory effort.  No retractions. Neurologic:  Normal speech and language. No gross focal neurologic deficits are appreciated.  Skin:  Skin is warm, dry and intact.  She has an erythematous and slightly raised rash on the inner part of both of her elbows, slightly worse on the right than the left, and it radiates both distally and proximally.  No involvement of the palms of her hands, and it does not extend past about the middle of her upper arm.  It appears most consistent with a contact dermatitis of unclear irritant. Psychiatric: Mood and affect are normal. Speech and behavior are normal.  ____________________________________________   LABS (all labs ordered are listed, but only abnormal results are displayed)  Labs Reviewed - No data to display ____________________________________________  EKG  No indication for EKG ____________________________________________  RADIOLOGY I, 12/08/19, personally viewed and evaluated these images (plain radiographs) as part of my medical decision making, as well as reviewing the written report by the radiologist.  ED MD interpretation: No indication for  emergent imaging  Official radiology report(s): No results found.  ____________________________________________   PROCEDURES   Procedure(s) performed (including Critical Care):  Procedures   ____________________________________________   INITIAL IMPRESSION / MDM / ASSESSMENT AND PLAN / ED COURSE  As part of my medical decision making, I reviewed the following data within the electronic MEDICAL RECORD NUMBER Nursing notes reviewed and incorporated and Notes from prior ED visits   Patient has what appears to be contact dermatitis as opposed to urticaria on the inner part of her arms.  Unclear what was the source of the irritation.  I am giving her a dose of Benadryl 50 mg by mouth and gave her hydrocortisone ointment to apply to the rash.  I encouraged her to follow-up  as an outpatient as needed and I gave the usual and customary return precautions.          ____________________________________________  FINAL CLINICAL IMPRESSION(S) / ED DIAGNOSES  Final diagnoses:  Irritant contact dermatitis, unspecified trigger     MEDICATIONS GIVEN DURING THIS VISIT:  Medications  hydrocortisone 1 % ointment ( Topical Given 10/08/19 0643)  diphenhydrAMINE (BENADRYL) capsule 50 mg (50 mg Oral Given 10/08/19 1517)     ED Discharge Orders    None      *Please note:  Natasha Hayes was evaluated in Emergency Department on 10/08/2019 for the symptoms described in the history of present illness. She was evaluated in the context of the global COVID-19 pandemic, which necessitated consideration that the patient might be at risk for infection with the SARS-CoV-2 virus that causes COVID-19. Institutional protocols and algorithms that pertain to the evaluation of patients at risk for COVID-19 are in a state of rapid change based on information released by regulatory bodies including the CDC and federal and state organizations. These policies and algorithms were followed during the patient's care  in the ED.  Some ED evaluations and interventions may be delayed as a result of limited staffing during the pandemic.*  Note:  This document was prepared using Dragon voice recognition software and may include unintentional dictation errors.   Hinda Kehr, MD 10/08/19 [phone]

## 2019-10-08 NOTE — ED Triage Notes (Signed)
Patient to ED with a rash on both arms. 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 No other areas with rash are noted.

## 2019-10-08 NOTE — Discharge Instructions (Addendum)
We do not know specifically what is causing your rash, but it does appear to be an allergic reaction rather than an infection or an indication of a more severe underlying illness.  Please take any prescribed medications as written and continue taking your normal prescription medications unless otherwise specified.  Follow up with the recommended physicians and return to the emergency department with any new or worsening symptoms that concern you, including but not limited to fever, lesions inside your mouth, etc. ° °

## 2019-12-13 NOTE — Progress Notes (Signed)
Patient, No Pcp Per   Chief Complaint  Patient presents with  . Contraception    interested in IUD    HPI:      Ms. Natasha Hayes is a 18 y.o. G0P0000 who LMP was Patient's last menstrual period was 11/21/2019 (approximate)., [facility], interested in IUD. Nexplanon in past, removed 8/19. Haven't seen pt since. States she did 1 or 2 depo injections this yr, last one due 9/20. Didn't want to continue. Now would like IUD. Still not having normal menses since stopping depo. Menses monthly, but only 2-3 days and different than usual, no BTB, mild dysmen, improved with tylenol.  She is sex active, using condoms. Occas with dyspareunia. Due for STD check--will do at IUD insertion.   Past Medical History:  Diagnosis Date  . ADHD   . Insomnia   . Mood disorder (HCC)   . Renal disorder    kdiney stones    History reviewed. No pertinent surgical history.  Family History  Problem Relation Age of Onset  . Cancer Paternal Grandfather        lung    Social History   Socioeconomic History  . Marital status: Single    Spouse name: Not on file  . Number of children: Not on file  . Years of education: Not on file  . Highest education level: Not on file  Occupational History  . Not on file  Tobacco Use  . Smoking status: Current Every Day Smoker  . Smokeless tobacco: Never Used  Substance and Sexual Activity  . Alcohol use: No  . Drug use: Yes    Types: Marijuana  . Sexual activity: Yes    Birth control/protection: None  Other Topics Concern  . Not on file  Social History Narrative  . Not on file   Social Determinants of Health   Financial Resource Strain:   . Difficulty of Paying Living Expenses: Not on file  Food Insecurity:   . Worried About Programme researcher, broadcasting/film/videounning Out of Food in the Last Year: Not on file  . Ran Out of Food in the Last Year: Not on file  Transportation Needs:   . Lack of Transportation (Medical): Not on file  . Lack of Transportation  (Non-Medical): Not on file  Physical Activity:   . Days of Exercise per Week: Not on file  . Minutes of Exercise per Session: Not on file  Stress:   . Feeling of Stress : Not on file  Social Connections:   . Frequency of Communication with Friends and Family: Not on file  . Frequency of Social Gatherings with Friends and Family: Not on file  . Attends Religious Services: Not on file  . Active Member of Clubs or Organizations: Not on file  . Attends BankerClub or Organization Meetings: Not on file  . Marital Status: Not on file  Intimate Partner Violence:   . Fear of Current or Ex-Partner: Not on file  . Emotionally Abused: Not on file  . Physically Abused: Not on file  . Sexually Abused: Not on file    Outpatient Medications Prior to Visit  Medication Sig Dispense Refill  . cetirizine (ZYRTEC) 10 MG tablet Take 10 mg by mouth at bedtime.    . cholecalciferol (VITAMIN D) 1000 units tablet Take 1,000 Units by mouth daily.    Marland Kitchen. dicyclomine (BENTYL) 20 MG tablet Take 1 tablet (20 mg total) by mouth every 6 (six) hours as needed. 20 tablet 0  . famotidine (  PEPCID) 20 MG tablet Take 1 tablet (20 mg total) by mouth 2 (two) times daily. 60 tablet 0  . fluticasone (FLONASE) 50 MCG/ACT nasal spray Place 1 spray into both nostrils daily.    Marland Kitchen lamoTRIgine (LAMICTAL) 100 MG tablet Take 100 mg by mouth daily.    Marland Kitchen lamoTRIgine (LAMICTAL) 25 MG tablet Take 25 mg by mouth daily.    Marland Kitchen lisdexamfetamine (VYVANSE) 40 MG capsule Take 40 mg by mouth every morning.    . Melatonin 3 MG TABS Take by mouth.    . norelgestromin-ethinyl estradiol [facility]) 150-35 MCG/24HR transdermal patch Place 1 patch onto the skin once a week. Apply 1 patch weekly for 3 weeks, then 1 week without patch 9 patch 3  . ondansetron (ZOFRAN ODT) 4 MG disintegrating tablet Take 1 tablet (4 mg total) by mouth every 8 (eight) hours as needed for nausea or vomiting. 20 tablet 0  . oxyCODONE-acetaminophen (PERCOCET) 5-325 MG tablet Take 1  tablet by mouth every 4 (four) hours as needed. 20 tablet 0   No facility-administered medications prior to visit.      ROS:  Review of Systems  Constitutional: Negative for fever.  Gastrointestinal: Negative for blood in stool, constipation, diarrhea, nausea and vomiting.  Genitourinary: Positive for dyspareunia and menstrual problem. Negative for dysuria, flank pain, frequency, hematuria, urgency, vaginal bleeding, vaginal discharge and vaginal pain.  Musculoskeletal: Negative for back pain.  Skin: Negative for rash.  Psychiatric/Behavioral: Positive for agitation and dysphoric mood.  BREAST: No symptoms   OBJECTIVE:   Vitals:  BP 110/70   Ht 5\' 3"  (1.6 m)   Wt 192 lb (87.1 kg)   LMP 11/21/2019 (Approximate)   BMI 34.01 kg/m   Physical Exam Vitals reviewed.  Constitutional:      Appearance: She is well-developed.  Pulmonary:     Effort: Pulmonary effort is normal.  Musculoskeletal:        General: Normal range of motion.     Cervical back: Normal range of motion.  Skin:    General: Skin is warm and dry.  Neurological:     General: No focal deficit present.     Mental Status: She is alert and oriented to person, place, and time.     Cranial Nerves: No cranial nerve deficit.  Psychiatric:        Mood and Affect: Mood normal.        Behavior: Behavior normal.        Thought Content: Thought content normal.        Judgment: Judgment normal.     Results: Results for orders placed or performed in visit on 12/14/19 (from the past 24 hour(s))  POCT urine pregnancy     Status: Normal   Collection Time: 12/14/19 11:03 AM  Result Value Ref Range   Preg Test, Ur Negative Negative     Assessment/Plan: Irregular menses - Plan: POCT urine pregnancy; Neg UPT. Normal to have irreg menses after stopping depo. Changing to IUD with next menses. Reassurance.  Encounter for initial prescription of intrauterine contraceptive device (IUD) - Plan: misoprostol (CYTOTEC) 100  MCG tablet; IUD discussed, including pros/cons/risks/benefits of [place] vs Paragard. [facility] handout given. RTO with menses for insertion/STD check. Rx cytotec/NSAIDs.   Meds ordered this encounter  Medications  . misoprostol (CYTOTEC) 100 MCG tablet    Sig: Take 1 tablet (100 mcg total) by mouth once for 1 dose. 1 hour before appt    Dispense:  1 tablet    Refill:  0    Order Specific Question:   Supervising Provider    Answer:   Gae Dry [[id number]]      Return if symptoms worsen or fail to improve.  Jowana Thumma B. Machele Deihl, PA-C 12/14/2019 2:11 PM

## 2019-12-14 ENCOUNTER — Encounter: Payer: Self-pay | Admitting: Obstetrics and Gynecology

## 2019-12-14 ENCOUNTER — Other Ambulatory Visit: Payer: Self-pay

## 2019-12-14 ENCOUNTER — Ambulatory Visit (INDEPENDENT_AMBULATORY_CARE_PROVIDER_SITE_OTHER): Payer: Medicaid Other | Admitting: Obstetrics and Gynecology

## 2019-12-14 VITALS — BP 110/70 | Ht 63.0 in | Wt 192.0 lb

## 2019-12-14 DIAGNOSIS — Z30014 Encounter for initial prescription of intrauterine contraceptive device: Secondary | ICD-10-CM | POA: Diagnosis not present

## 2019-12-14 DIAGNOSIS — Z3202 Encounter for pregnancy test, result negative: Secondary | ICD-10-CM

## 2019-12-14 DIAGNOSIS — N925 Other specified irregular menstruation: Secondary | ICD-10-CM

## 2019-12-14 DIAGNOSIS — N926 Irregular menstruation, unspecified: Secondary | ICD-10-CM

## 2019-12-14 LAB — POCT URINE PREGNANCY: Preg Test, Ur: NEGATIVE

## 2019-12-14 MED ORDER — MISOPROSTOL 100 MCG PO TABS: 100.0000 ug | ORAL_TABLET | Freq: Once | ORAL | 0 refills | Status: DC

## 2019-12-14 NOTE — Patient Instructions (Signed)
I value your feedback and entrusting us with your care. If you get a  patient survey, I would appreciate you taking the time to let us know about your experience today. Thank you!  As of December 08, 2019, your lab results will be released to your MyChart immediately, before I even have a chance to see them. Please give me time to review them and contact you if there are any abnormalities. Thank you for your patience.  

## 2019-12-21 ENCOUNTER — Telehealth: Payer: Self-pay | Admitting: Obstetrics and Gynecology

## 2019-12-21 NOTE — Telephone Encounter (Signed)
12/31 at 930 for Paragard insert with ABC

## 2019-12-27 NOTE — Telephone Encounter (Signed)
Noted. Paragard reserved for this patient. 

## 2019-12-28 NOTE — Patient Instructions (Signed)
I value your feedback and entrusting us with your care. If you get a [facility] patient survey, I would appreciate you taking the time to let us know about your experience today. Thank you!  As of December 08, 2019, your lab results will be released to your MyChart immediately, before I even have a chance to see them. Please give me time to review them and contact you if there are any abnormalities. Thank you for your patience.  

## 2019-12-28 NOTE — Progress Notes (Signed)
   Chief Complaint  Patient presents with  . Contraception    Paragard insertion      IUD PROCEDURE NOTE:  Natasha Hayes is a 18 y.o. G0P0000 here for Paragard  IUD insertion for [facility].    BP 120/82   Ht 5\' 3"  (1.6 m)   Wt 193 lb (87.5 kg)   LMP 12/21/2019 (Exact Date)   BMI 34.19 kg/m   IUD Insertion Procedure Note Patient identified, informed consent performed, consent signed.   Discussed risks of irregular bleeding, cramping, infection, malpositioning or misplacement of the IUD outside the uterus which may require further procedure such as laparoscopy, risk of failure <1%. Time out was performed.    Speculum placed in the vagina.  Cervix visualized.  Cleaned with Betadine x 2.  Grasped anteriorly with a single tooth tenaculum.  Uterus sounded to 8.0 cm.   IUD placed per manufacturer's recommendations.  Strings trimmed to 3 cm. Tenaculum was removed, good hemostasis noted.  Patient tolerated procedure well.   ASSESSMENT:  Encounter for insertion of intrauterine contraceptive device (IUD) - Plan: paragard intrauterine copper IUD  Screening for STD (sexually transmitted disease) - Plan: [place] STD   Meds ordered this encounter  Medications  . paragard intrauterine copper IUD     Plan:  Patient was given post-procedure instructions.  She was advised to have backup contraception for one week.   Call if you are having increasing pain, cramps or bleeding or if you have a fever greater than 100.4 degrees F., shaking chills, nausea or vomiting. Patient was also asked to check IUD strings periodically and follow up in 4 weeks for IUD check.  Return in about 4 weeks (around 01/26/2020) for IUD f/u.  Delshon Blanchfield B. Briley Bumgarner, PA-C 12/29/2019 12:15 PM

## 2019-12-29 ENCOUNTER — Ambulatory Visit (INDEPENDENT_AMBULATORY_CARE_PROVIDER_SITE_OTHER): Payer: Medicaid Other | Admitting: Obstetrics and Gynecology

## 2019-12-29 ENCOUNTER — Encounter: Payer: Self-pay | Admitting: Obstetrics and Gynecology

## 2019-12-29 ENCOUNTER — Other Ambulatory Visit (HOSPITAL_COMMUNITY)
Admission: RE | Admit: 2019-12-29 | Discharge: 2019-12-29 | Disposition: A | Discharge status: Home / Self Care | Payer: Medicaid Other | Source: Ambulatory Visit | Attending: Obstetrics and Gynecology | Admitting: Obstetrics and Gynecology

## 2019-12-29 ENCOUNTER — Other Ambulatory Visit: Payer: Self-pay

## 2019-12-29 VITALS — BP 120/82 | Ht 63.0 in | Wt 193.0 lb

## 2019-12-29 DIAGNOSIS — Z30430 Encounter for insertion of intrauterine contraceptive device: Secondary | ICD-10-CM | POA: Diagnosis not present

## 2019-12-29 DIAGNOSIS — Z113 Encounter for screening for infections with a predominantly sexual mode of transmission: Secondary | ICD-10-CM

## 2019-12-29 MED ORDER — PARAGARD INTRAUTERINE COPPER IU IUD: INTRAUTERINE_SYSTEM | Freq: Once | INTRAUTERINE | Status: DC

## 2020-01-02 LAB — CERVICOVAGINAL ANCILLARY ONLY
Chlamydia: NEGATIVE
Comment: NEGATIVE
Comment: NORMAL
Neisseria Gonorrhea: NEGATIVE

## 2020-01-26 ENCOUNTER — Ambulatory Visit (INDEPENDENT_AMBULATORY_CARE_PROVIDER_SITE_OTHER): Payer: Medicaid Other | Admitting: Obstetrics and Gynecology

## 2020-01-26 ENCOUNTER — Encounter: Payer: Self-pay | Admitting: Obstetrics and Gynecology

## 2020-01-26 ENCOUNTER — Other Ambulatory Visit: Payer: Self-pay

## 2020-01-26 VITALS — BP 110/90 | Ht 63.0 in | Wt 197.0 lb

## 2020-01-26 DIAGNOSIS — B9689 Other specified bacterial agents as the cause of diseases classified elsewhere: Secondary | ICD-10-CM

## 2020-01-26 DIAGNOSIS — R300 Dysuria: Secondary | ICD-10-CM | POA: Diagnosis not present

## 2020-01-26 DIAGNOSIS — N760 Acute vaginitis: Secondary | ICD-10-CM | POA: Diagnosis not present

## 2020-01-26 DIAGNOSIS — Z30431 Encounter for routine checking of intrauterine contraceptive device: Secondary | ICD-10-CM | POA: Diagnosis not present

## 2020-01-26 LAB — POCT WET PREP WITH KOH
KOH Prep POC: POSITIVE — AB
Trichomonas, UA: NEGATIVE
Yeast Wet Prep HPF POC: NEGATIVE

## 2020-01-26 LAB — POCT URINALYSIS DIPSTICK
Bilirubin, UA: NEGATIVE
Blood, UA: NEGATIVE
Glucose, UA: NEGATIVE
Ketones, UA: NEGATIVE
Leukocytes, UA: NEGATIVE
Nitrite, UA: NEGATIVE
Protein, UA: NEGATIVE
Spec Grav, UA: 1.015 (ref 1.010–1.025)
pH, UA: 6 (ref 5.0–8.0)

## 2020-01-26 MED ORDER — METRONIDAZOLE 500 MG PO TABS: 500.0000 mg | ORAL_TABLET | Freq: Two times a day (BID) | ORAL | 0 refills | Status: AC

## 2020-01-26 NOTE — Progress Notes (Signed)
Patient, No Pcp Per   Chief Complaint  Patient presents with  . IUD check  . Urinary Tract Infection    burning when urinating, a little back pain, no frequency/blood  . Vaginal Discharge    sour odor, no itchiness or irritation    HPI:      Ms. Natasha Hayes is a 19 y.o. G0P0000 who LMP was Patient's last menstrual period was 01/16/2020 (approximate)., presents today for 1 mo f/u for Paragard insertion. Pt had menses last wk. No other BTB/cramping. No dyspareunia. Has noticed increased d/c with bad odor, no irritation. Also with dysuria, mild LBP, withotu frequency/urgency, hematuria, pelvic pain. No hx of BV in past. NO new sex partners. Had neg STD testing 12/20.     There are no problems to display for this patient.   History reviewed. No pertinent surgical history.  Family History  Problem Relation Age of Onset  . Cancer Paternal Grandfather        lung    Social History   Socioeconomic History  . Marital status: Single    Spouse name: Not on file  . Number of children: Not on file  . Years of education: Not on file  . Highest education level: Not on file  Occupational History  . Not on file  Tobacco Use  . Smoking status: Current Every Day Smoker  . Smokeless tobacco: Never Used  Substance and Sexual Activity  . Alcohol use: No  . Drug use: Yes    Types: Marijuana  . Sexual activity: Yes    Birth control/protection: I.U.D.    Comment: Paragard  Other Topics Concern  . Not on file  Social History Narrative  . Not on file   Social Determinants of Health   Financial Resource Strain:   . Difficulty of Paying Living Expenses: Not on file  Food Insecurity:   . Worried About Programme researcher, broadcasting/film/video in the Last Year: Not on file  . Ran Out of Food in the Last Year: Not on file  Transportation Needs:   . Lack of Transportation (Medical): Not on file  . Lack of Transportation (Non-Medical): Not on file  Physical Activity:   . Days of Exercise per Week:  Not on file  . Minutes of Exercise per Session: Not on file  Stress:   . Feeling of Stress : Not on file  Social Connections:   . Frequency of Communication with Friends and Family: Not on file  . Frequency of Social Gatherings with Friends and Family: Not on file  . Attends Religious Services: Not on file  . Active Member of Clubs or Organizations: Not on file  . Attends Banker Meetings: Not on file  . Marital Status: Not on file  Intimate Partner Violence:   . Fear of Current or Ex-Partner: Not on file  . Emotionally Abused: Not on file  . Physically Abused: Not on file  . Sexually Abused: Not on file    Outpatient Medications Prior to Visit  Medication Sig Dispense Refill  . misoprostol (CYTOTEC) 100 MCG tablet Take 1 tablet (100 mcg total) by mouth once for 1 dose. 1 hour before appt 1 tablet 0   Facility-Administered Medications Prior to Visit  Medication Dose Route Frequency Provider Last Rate Last Admin  . paragard intrauterine copper IUD   Intrauterine Once Celestial Barnfield B, PA-C          ROS:  Review of Systems  Constitutional: Negative for fever.  Gastrointestinal: Negative for blood in stool, constipation, diarrhea, nausea and vomiting.  Genitourinary: Positive for dysuria and vaginal discharge. Negative for dyspareunia, flank pain, frequency, hematuria, urgency, vaginal bleeding and vaginal pain.  Musculoskeletal: Negative for back pain.  Skin: Negative for rash.    OBJECTIVE:   Vitals:  BP 110/90   Ht 5\' 3"  (1.6 m)   Wt 197 lb (89.4 kg)   LMP 01/16/2020 (Approximate)   BMI 34.90 kg/m   Physical Exam Vitals reviewed.  Constitutional:      Appearance: She is well-developed.  Pulmonary:     Effort: Pulmonary effort is normal.  Genitourinary:    General: Normal vulva.     Pubic Area: No rash.      Labia:        Right: No rash, tenderness or lesion.        Left: No rash, tenderness or lesion.      Vagina: Normal. No vaginal  discharge, erythema or tenderness.     Cervix: Normal.     Uterus: Normal. Not enlarged and not tender.      Adnexa: Right adnexa normal and left adnexa normal.       Right: No mass or tenderness.         Left: No mass or tenderness.       Comments: IUD STRINGS IN CX OS Musculoskeletal:        General: Normal range of motion.     Cervical back: Normal range of motion.  Skin:    General: Skin is warm and dry.  Neurological:     General: No focal deficit present.     Mental Status: She is alert and oriented to person, place, and time.  Psychiatric:        Mood and Affect: Mood normal.        Behavior: Behavior normal.        Thought Content: Thought content normal.        Judgment: Judgment normal.     Results: Results for orders placed or performed in visit on 01/26/20 (from the past 24 hour(s))  POCT Wet Prep with KOH     Status: Abnormal   Collection Time: 01/26/20  2:47 PM  Result Value Ref Range   Trichomonas, UA Negative    Clue Cells Wet Prep HPF POC few    Epithelial Wet Prep HPF POC     Yeast Wet Prep HPF POC neg    Bacteria Wet Prep HPF POC     RBC Wet Prep HPF POC     WBC Wet Prep HPF POC     KOH Prep POC Positive (A) Negative  POCT Urinalysis Dipstick     Status: Normal   Collection Time: 01/26/20  2:47 PM  Result Value Ref Range   Color, UA pale    Clarity, UA clear    Glucose, UA Negative Negative   Bilirubin, UA neg    Ketones, UA neg    Spec Grav, UA 1.015 1.010 - 1.025   Blood, UA neg    pH, UA 6.0 5.0 - 8.0   Protein, UA Negative Negative   Urobilinogen, UA     Nitrite, UA neg    Leukocytes, UA Negative Negative   Appearance     Odor       Assessment/Plan: Encounter for routine checking of intrauterine contraceptive device (IUD)--IUD strings in cx os, doing well.   Bacterial vaginosis - Plan: metroNIDAZOLE (FLAGYL) 500 MG tablet, POCT Wet Prep with KOH;  Pos sx/wet prep. Rx flagyl. No EtOH. F/u prn.   Dysuria - Plan: POCT Urinalysis  Dipstick; neg UA. Question related to BV. F/u prn.    Meds ordered this encounter  Medications  . metroNIDAZOLE (FLAGYL) 500 MG tablet    Sig: Take 1 tablet (500 mg total) by mouth 2 (two) times daily for 7 days.    Dispense:  14 tablet    Refill:  0    Order Specific Question:   Supervising Provider    Answer:   Nadara Mustard [[id number]]      Return if symptoms worsen or fail to improve.  Ezriel Boffa B. Esty Ahuja, PA-C 01/26/2020 2:49 PM

## 2020-01-26 NOTE — Patient Instructions (Signed)
I value your feedback and entrusting us with your care. If you get a [facility] patient survey, I would appreciate you taking the time to let us know about your experience today. Thank you!  As of December 08, 2019, your lab results will be released to your MyChart immediately, before I even have a chance to see them. Please give me time to review them and contact you if there are any abnormalities. Thank you for your patience.  

## 2020-02-04 ENCOUNTER — Other Ambulatory Visit: Payer: Self-pay

## 2020-02-04 ENCOUNTER — Emergency Department
Admission: EM | Admit: 2020-02-04 | Discharge: 2020-02-04 | Disposition: A | Discharge status: Home / Self Care | Payer: Medicaid Other | Attending: Emergency Medicine | Admitting: Emergency Medicine

## 2020-02-04 DIAGNOSIS — R102 Pelvic and perineal pain: Secondary | ICD-10-CM | POA: Insufficient documentation

## 2020-02-04 DIAGNOSIS — Z03823 Encounter for observation for suspected inserted (injected) foreign body ruled out: Secondary | ICD-10-CM | POA: Diagnosis not present

## 2020-02-04 DIAGNOSIS — F17200 Nicotine dependence, unspecified, uncomplicated: Secondary | ICD-10-CM | POA: Diagnosis not present

## 2020-02-04 NOTE — Discharge Instructions (Addendum)
You were seen today for possible foreign body of the vagina. There was no evidence of a retained tampon. Follow up with your GYN if you continue to have pelvic cramping.

## 2020-02-04 NOTE — ED Provider Notes (Signed)
[facility] Emergency Department Provider Note ____________________________________________  Time seen: 2115  I have reviewed the triage vital signs and the nursing notes.  HISTORY  Chief Complaint  Foreign Body   HPI Natasha Hayes is a 19 y.o. female presents to the clinic today with c/o retained foreign body in the vagina. She thinks it may be a tampon but isn't 100% sure.  She knows she put a tampon in last night but is not sure if she took it out. She is having some pelvic cramping but no abdominal pain, nausea, vomiting, vaginal discharge, odor or abnormal bleeding. She did have some bleeding after trying to retrieve the tampon by herself. She denies fever or chills.    Past Medical History:  Diagnosis Date  . ADHD   . Insomnia   . Mood disorder (HCC)   . Renal disorder    kdiney stones    There are no problems to display for this patient.   No past surgical history on file.  Prior to Admission medications   Not on File    Allergies Patient has no known allergies.  Family History  Problem Relation Age of Onset  . Cancer Paternal Grandfather        lung    Social History Social History   Tobacco Use  . Smoking status: Current Every Day Smoker  . Smokeless tobacco: Never Used  Substance Use Topics  . Alcohol use: No  . Drug use: Yes    Types: Marijuana    Review of Systems  Constitutional: Negative for fever, chills or body aches. Cardiovascular: Negative for chest tightness or chest pain. Respiratory: Negative for cough or shortness of breath. Gastrointestinal: Pt reports abdominal cramping. Negative for abdominal pain, nausea, vomiting and diarrhea. Genitourinary: Negative for urgency, frequency, dysuria or blood in urine. ____________________________________________  PHYSICAL EXAM:  VITAL SIGNS: ED Triage Vitals  Enc Vitals Group     BP 02/04/20 2039 130/79     Pulse Rate 02/04/20 2039 (!) 111     Resp 02/04/20 2039 17      Temp 02/04/20 2039 98.4 F (36.9 C)     Temp Source 02/04/20 2039 Oral     SpO2 02/04/20 2039 98 %     Weight 02/04/20 2037 197 lb (89.4 kg)     Height 02/04/20 2037 5\' 3"  (1.6 m)     Head Circumference --      Peak Flow --      Pain Score 02/04/20 2037 0     Pain Loc --      Pain Edu? --      Excl. in GC? --     Constitutional: Alert and oriented. Well appearing and in no distress. Cardiovascular: Normal rate, regular rhythm.  Respiratory: Normal respiratory effort. No wheezes/rales/rhonchi. Gastrointestinal: Soft and nontender. Pelvic: Normal female anatomy. White discharge noted in the vaginal vault. Cervix without changes. IUD strings noted. No vaginal foreign body noted. Neurologic:  Normal speech and language. No gross focal neurologic deficits are appreciated.  ____________________________________________   INITIAL IMPRESSION / ASSESSMENT AND PLAN / ED COURSE  Possible Foreign Body of Vagina, Pelvic Cramping:  No s/s of PID Exam does not reveal foreign body Offered pelvic/transvaginal ultrasound but she declines at this time Pt request discharge Advised to follow up with GYN as outpatient ____________________________________________  FINAL CLINICAL IMPRESSION(S) / ED DIAGNOSES  Final diagnoses:  Encounter for observation for suspected inserted (injected) foreign body ruled out      [place],  Coralie Keens, NP 02/04/20 2154    Carrie Mew, MD 02/04/20 2350

## 2020-02-04 NOTE — ED Triage Notes (Signed)
Thinks she has left a tampon in and forgot to take it out last night.

## 2020-03-01 ENCOUNTER — Other Ambulatory Visit: Payer: Self-pay

## 2020-03-01 ENCOUNTER — Encounter: Payer: Self-pay | Admitting: Emergency Medicine

## 2020-03-01 DIAGNOSIS — R1084 Generalized abdominal pain: Secondary | ICD-10-CM | POA: Insufficient documentation

## 2020-03-01 DIAGNOSIS — F17210 Nicotine dependence, cigarettes, uncomplicated: Secondary | ICD-10-CM | POA: Diagnosis not present

## 2020-03-01 DIAGNOSIS — F909 Attention-deficit hyperactivity disorder, unspecified type: Secondary | ICD-10-CM | POA: Insufficient documentation

## 2020-03-01 DIAGNOSIS — R112 Nausea with vomiting, unspecified: Secondary | ICD-10-CM | POA: Diagnosis not present

## 2020-03-01 LAB — CBC
HCT: 40.9 % (ref 36.0–46.0)
Hemoglobin: 13.9 g/dL (ref 12.0–15.0)
MCH: 30.8 pg (ref 26.0–34.0)
MCHC: 34 g/dL (ref 30.0–36.0)
MCV: 90.5 fL (ref 80.0–100.0)
Platelets: 212 10*3/uL (ref 150–400)
RBC: 4.52 MIL/uL (ref 3.87–5.11)
RDW: 12.2 % (ref 11.5–15.5)
WBC: 9 10*3/uL (ref 4.0–10.5)
nRBC: 0 % (ref 0.0–0.2)

## 2020-03-01 MED ORDER — SODIUM CHLORIDE 0.9% FLUSH: 3.0000 mL | Freq: Once | INTRAVENOUS | Status: DC

## 2020-03-01 NOTE — ED Triage Notes (Signed)
PT presents to ED via EMS with sharp abd pain the past couple of days; painful with palpation. EMS HR 105 BP 155/80. States she could be pregnant but is unsure. zofran IV prior to arrival. Pain located LLQ and radiates to her RUQ. Vomiting X4 today.

## 2020-03-02 ENCOUNTER — Emergency Department
Admission: EM | Admit: 2020-03-02 | Discharge: 2020-03-02 | Disposition: A | Discharge status: Home / Self Care | Payer: Medicaid Other | Attending: Emergency Medicine | Admitting: Emergency Medicine

## 2020-03-02 ENCOUNTER — Emergency Department: Payer: Medicaid Other

## 2020-03-02 DIAGNOSIS — R1084 Generalized abdominal pain: Secondary | ICD-10-CM

## 2020-03-02 DIAGNOSIS — R112 Nausea with vomiting, unspecified: Secondary | ICD-10-CM

## 2020-03-02 LAB — COMPREHENSIVE METABOLIC PANEL
ALT: 65 U/L — ABNORMAL HIGH (ref 0–44)
AST: 38 U/L (ref 15–41)
Albumin: 4.4 g/dL (ref 3.5–5.0)
Alkaline Phosphatase: 58 U/L (ref 38–126)
Anion gap: 7 (ref 5–15)
BUN: 12 mg/dL (ref 6–20)
CO2: 22 mmol/L (ref 22–32)
Calcium: 9 mg/dL (ref 8.9–10.3)
Chloride: 108 mmol/L (ref 98–111)
Creatinine, Ser: 0.73 mg/dL (ref 0.44–1.00)
GFR calc Af Amer: >60 mL/min (ref >60)
GFR calc non Af Amer: >60 mL/min (ref >60)
Glucose, Bld: 99 mg/dL (ref 70–99)
Potassium: 3.3 mmol/L — ABNORMAL LOW (ref 3.5–5.1)
Sodium: 137 mmol/L (ref 135–145)
Total Bilirubin: 0.5 mg/dL (ref 0.3–1.2)
Total Protein: 7 g/dL (ref 6.5–8.1)

## 2020-03-02 LAB — URINALYSIS, COMPLETE (UACMP) WITH MICROSCOPIC
Bacteria, UA: NONE SEEN
Bilirubin Urine: NEGATIVE
Glucose, UA: NEGATIVE mg/dL
Hgb urine dipstick: NEGATIVE
Leukocytes,Ua: NEGATIVE
Nitrite: NEGATIVE
Protein, ur: NEGATIVE mg/dL
RBC / HPF: NONE SEEN RBC/hpf (ref 0–5)
Specific Gravity, Urine: 1.025 (ref 1.005–1.030)
pH: 6.5 (ref 5.0–8.0)

## 2020-03-02 LAB — LIPASE, BLOOD: Lipase: 23 U/L (ref 11–51)

## 2020-03-02 LAB — POCT PREGNANCY, URINE: Preg Test, Ur: NEGATIVE

## 2020-03-02 MED ORDER — IOHEXOL 300 MG/ML  SOLN
100.0000 mL | Freq: Once | INTRAMUSCULAR | Status: AC | PRN
  Administered 2020-03-02: 100 mL via INTRAVENOUS

## 2020-03-02 MED ORDER — SODIUM CHLORIDE 0.9 % IV BOLUS
1000.0000 mL | Freq: Once | INTRAVENOUS | Status: AC
  Administered 2020-03-02: 1000 mL via INTRAVENOUS

## 2020-03-02 MED ORDER — DICYCLOMINE HCL 20 MG PO TABS: 20.0000 mg | ORAL_TABLET | Freq: Four times a day (QID) | ORAL | 0 refills | Status: DC

## 2020-03-02 MED ORDER — ONDANSETRON HCL 4 MG/2ML IJ SOLN
4.0000 mg | Freq: Once | INTRAMUSCULAR | Status: AC
  Administered 2020-03-02: 4 mg via INTRAVENOUS
  Filled 2020-03-02: qty 2

## 2020-03-02 MED ORDER — FENTANYL CITRATE (PF) 100 MCG/2ML IJ SOLN
50.0000 ug | Freq: Once | INTRAMUSCULAR | Status: AC
  Administered 2020-03-02: 50 ug via INTRAVENOUS
  Filled 2020-03-02: qty 2

## 2020-03-02 MED ORDER — ONDANSETRON 4 MG PO TBDP: 4.0000 mg | ORAL_TABLET | Freq: Three times a day (TID) | ORAL | 0 refills | Status: DC | PRN

## 2020-03-02 MED ORDER — IOHEXOL 9 MG/ML PO SOLN
500.0000 mL | ORAL | Status: AC
  Administered 2020-03-02 (×2): 500 mL via ORAL

## 2020-03-02 NOTE — ED Provider Notes (Signed)
[facility] Emergency Department Provider Note   ____________________________________________   First MD Initiated Contact with Patient 03/02/20 [phone]     (approximate)  I have reviewed the triage vital signs and the nursing notes.   HISTORY  Chief Complaint Abdominal Pain    HPI Natasha Hayes is a 19 y.o. female brought to the ED via EMS from home with a chief complaint of abdominal pain.  Patient reports a 2-day history of sharp abdominal pain which began in her left lower quadrant, now radiating to her right upper quadrant.  Nausea/vomiting x4 today.  Denies associated fever, cough, chest pain, shortness of breath, dysuria or diarrhea.       Past Medical History:  Diagnosis Date  . ADHD   . Insomnia   . Mood disorder ([place])   . Renal disorder    kdiney stones    There are no problems to display for this patient.   History reviewed. No pertinent surgical history.  Prior to Admission medications   Medication Sig Start Date End Date Taking? Authorizing Provider  dicyclomine (BENTYL) 20 MG tablet Take 1 tablet (20 mg total) by mouth every 6 (six) hours. 03/02/20   Paulette Blanch, MD  ondansetron (ZOFRAN ODT) 4 MG disintegrating tablet Take 1 tablet (4 mg total) by mouth every 8 (eight) hours as needed for nausea or vomiting. 03/02/20   Paulette Blanch, MD    Allergies Patient has no known allergies.  Family History  Problem Relation Age of Onset  . Cancer Paternal Grandfather        lung    Social History Social History   Tobacco Use  . Smoking status: Current Every Day Smoker  . Smokeless tobacco: Never Used  Substance Use Topics  . Alcohol use: Yes  . Drug use: Yes    Types: Marijuana    Review of Systems  Constitutional: No fever/chills Eyes: No visual changes. ENT: No sore throat. Cardiovascular: Denies chest pain. Respiratory: Denies shortness of breath. Gastrointestinal: Positive for abdominal pain, nausea and vomiting.  No  diarrhea.  No constipation. Genitourinary: Negative for dysuria. Musculoskeletal: Negative for back pain. Skin: Negative for rash. Neurological: Negative for headaches, focal weakness or numbness.   ____________________________________________   PHYSICAL EXAM:  VITAL SIGNS: ED Triage Vitals  Enc Vitals Group     BP 03/01/20 2333 134/77     Pulse Rate 03/01/20 2333 89     Resp 03/01/20 2333 18     Temp 03/01/20 2333 98.7 F (37.1 C)     Temp Source 03/01/20 2333 Oral     SpO2 03/01/20 2333 97 %     Weight 03/01/20 2334 192 lb (87.1 kg)     Height 03/01/20 2334 5\' 3"  (1.6 m)     Head Circumference --      Peak Flow --      Pain Score --      Pain Loc --      Pain Edu? --      Excl. in [place]? --     Constitutional: Alert and oriented. Well appearing and in no acute distress. Eyes: Conjunctivae are normal. PERRL. EOMI. Head: Atraumatic. Nose: No congestion/rhinnorhea. Mouth/Throat: Mucous membranes are moist.  Oropharynx non-erythematous. Neck: No stridor.   Cardiovascular: Normal rate, regular rhythm. Grossly normal heart sounds.  Good peripheral circulation. Respiratory: Normal respiratory effort.  No retractions. Lungs CTAB. Gastrointestinal: Soft and nontender. No distention. No abdominal bruits. No CVA tenderness. Musculoskeletal: No lower extremity tenderness  nor edema.  No joint effusions. Neurologic:  Normal speech and language. No gross focal neurologic deficits are appreciated. No gait instability. Skin:  Skin is warm, dry and intact. No rash noted. Psychiatric: Mood and affect are normal. Speech and behavior are normal.  ____________________________________________   LABS (all labs ordered are listed, but only abnormal results are displayed)  Labs Reviewed  COMPREHENSIVE METABOLIC PANEL - Abnormal; Notable for the following components:      Result Value   Potassium 3.3 (*)    ALT 65 (*)    All other components within normal limits  URINALYSIS, COMPLETE  (UACMP) WITH MICROSCOPIC - Abnormal; Notable for the following components:   Ketones, ur TRACE (*)    All other components within normal limits  CBC  LIPASE, BLOOD  POC URINE PREG, ED  POCT PREGNANCY, URINE   ____________________________________________  EKG  None ____________________________________________  RADIOLOGY  ED MD interpretation: No acute abnormality in the abdomen/pelvis  Official radiology report(s): CT Abdomen Pelvis W Contrast  Result Date: 03/02/2020 CLINICAL DATA:  Abdominal pain EXAM: CT ABDOMEN AND PELVIS WITH CONTRAST TECHNIQUE: Multidetector CT imaging of the abdomen and pelvis was performed using the standard protocol following bolus administration of intravenous contrast. CONTRAST:  OMNIPAQUE IOHEXOL 300 MG/ML  SOLN COMPARISON:  August 23, 2019 FINDINGS: Lower chest: The visualized heart size within normal limits. No pericardial fluid/thickening. No hiatal hernia. The visualized portions of the lungs are clear. Hepatobiliary: Multifocal heterogeneous areas of fatty infiltration are seen throughout the liver parenchyma.The main portal vein is patent. No evidence of calcified gallstones, gallbladder wall thickening or biliary dilatation. Pancreas: Unremarkable. No pancreatic ductal dilatation or surrounding inflammatory changes. Spleen: Normal in size without focal abnormality. Adrenals/Urinary Tract: Both adrenal glands appear normal. The kidneys and collecting system appear normal without evidence of urinary tract calculus or hydronephrosis. Bladder is unremarkable. Stomach/Bowel: The stomach, small bowel, and colon are normal in appearance. No inflammatory changes, wall thickening, or obstructive findings.The appendix is normal. Vascular/Lymphatic: There are no enlarged mesenteric, retroperitoneal, or pelvic lymph nodes. No significant vascular findings are present. Reproductive: IUD seen within the upper endometrial canal. Other: No evidence of abdominal wall mass  or hernia. Musculoskeletal: No acute or significant osseous findings. IMPRESSION: Heterogeneous area of focal fatty infiltration slightly more prominent than the prior exam. No acute intra-abdominal or pelvic pathology to explain the patient's symptoms. Electronically Signed   By: Jonna Clark M.D.   On: 03/02/2020 04:10    ____________________________________________   PROCEDURES  Procedure(s) performed (including Critical Care):  Procedures   ____________________________________________   INITIAL IMPRESSION / ASSESSMENT AND PLAN / ED COURSE  As part of my medical decision making, I reviewed the following data within the electronic MEDICAL RECORD NUMBER Nursing notes reviewed and incorporated, Labs reviewed, Old chart reviewed, Radiograph reviewed and Notes from prior ED visits     Natasha Hayes was evaluated in Emergency Department on 03/02/2020 for the symptoms described in the history of present illness. She was evaluated in the context of the global COVID-19 pandemic, which necessitated consideration that the patient might be at risk for infection with the SARS-CoV-2 virus that causes COVID-19. Institutional protocols and algorithms that pertain to the evaluation of patients at risk for COVID-19 are in a state of rapid change based on information released by regulatory bodies including the CDC and federal and state organizations. These policies and algorithms were followed during the patient's care in the ED.    19 year old female who presents with abdominal  pain, nausea and vomiting. Differential diagnosis includes, but is not limited to, ovarian cyst, ovarian torsion, acute appendicitis, diverticulitis, urinary tract infection/pyelonephritis, endometriosis, bowel obstruction, colitis, renal colic, gastroenteritis, hernia, fibroids, endometriosis, pregnancy related pain including ectopic pregnancy, etc.  Laboratory and urinalysis results unremarkable.  Will proceed to CT abdomen/ pelvis to  evaluate for intra-abdominal etiology of patient's symptoms.  Administer IV analgesia and IV antiemetic in addition to IV fluids.   Clinical Course as of Mar 03 603  Fri Mar 02, 2020  0430 Patient resting in no acute distress.  Updated her on CT imaging result.  Strict return precautions given.  Patient verbalizes understanding and agrees with plan of care.   [JS]    Clinical Course User Index [JS] Irean Hong, MD     ____________________________________________   FINAL CLINICAL IMPRESSION(S) / ED DIAGNOSES  Final diagnoses:  Generalized abdominal pain  Non-intractable vomiting with nausea, unspecified vomiting type     ED Discharge Orders         Ordered    dicyclomine (BENTYL) 20 MG tablet  Every 6 hours     03/02/20 0432    ondansetron (ZOFRAN ODT) 4 MG disintegrating tablet  Every 8 hours PRN     03/02/20 0432           Note:  This document was prepared using Dragon voice recognition software and may include unintentional dictation errors.   Irean Hong, MD 03/02/20 [phone]

## 2020-03-02 NOTE — Discharge Instructions (Signed)
1.  You may take medicines as needed for abdominal cramps and nausea (Bentyl/Zofran #20). 2.  Clear liquids x12 hours, then bland diet x2 days, then slowly advance diet as tolerated. 3.  Return to the ER for worsening symptoms, persistent vomiting, difficulty breathing or other concerns.

## 2020-03-13 ENCOUNTER — Ambulatory Visit (INDEPENDENT_AMBULATORY_CARE_PROVIDER_SITE_OTHER): Payer: Medicaid Other | Admitting: Obstetrics and Gynecology

## 2020-03-13 ENCOUNTER — Other Ambulatory Visit (HOSPITAL_COMMUNITY)
Admission: RE | Admit: 2020-03-13 | Discharge: 2020-03-13 | Disposition: A | Discharge status: Home / Self Care | Payer: Medicaid Other | Source: Ambulatory Visit | Attending: Obstetrics and Gynecology | Admitting: Obstetrics and Gynecology

## 2020-03-13 ENCOUNTER — Encounter: Payer: Self-pay | Admitting: Obstetrics and Gynecology

## 2020-03-13 ENCOUNTER — Other Ambulatory Visit: Payer: Self-pay

## 2020-03-13 VITALS — BP 100/70 | Ht 63.0 in | Wt 192.0 lb

## 2020-03-13 DIAGNOSIS — Z30432 Encounter for removal of intrauterine contraceptive device: Secondary | ICD-10-CM

## 2020-03-13 DIAGNOSIS — Z3202 Encounter for pregnancy test, result negative: Secondary | ICD-10-CM | POA: Diagnosis not present

## 2020-03-13 DIAGNOSIS — Z113 Encounter for screening for infections with a predominantly sexual mode of transmission: Secondary | ICD-10-CM | POA: Insufficient documentation

## 2020-03-13 DIAGNOSIS — R102 Pelvic and perineal pain: Secondary | ICD-10-CM

## 2020-03-13 DIAGNOSIS — N938 Other specified abnormal uterine and vaginal bleeding: Secondary | ICD-10-CM | POA: Diagnosis not present

## 2020-03-13 DIAGNOSIS — Z30015 Encounter for initial prescription of vaginal ring hormonal contraceptive: Secondary | ICD-10-CM

## 2020-03-13 LAB — POCT URINE PREGNANCY: Preg Test, Ur: NEGATIVE

## 2020-03-13 MED ORDER — ETONOGESTREL-ETHINYL ESTRADIOL 0.12-0.015 MG/24HR VA RING: VAGINAL_RING | VAGINAL | 3 refills | Status: DC

## 2020-03-13 NOTE — Progress Notes (Signed)
Chief Complaint  Patient presents with  . Contraception    IUD removal due to a lot of pelvic pain, 2 positive UPT's at home, would like an in office UPT     History of Present Illness:  Natasha Hayes is a 19 y.o. that had a Paragard IUD placed approximately 3 months ago. Since that time, she has been having pelvic pains and irregular bleeding. She is sex active, with some dyspareunia. No new partners. Went to ED 03/02/20 due to abd/LLQ pain with neg CT scan/IUD in correct location. Pt with increased constipation recently. Has had nexplanon and depo in past with side effects. Would like to try nuvaring. No hx of HTN, DVTs, migraines.   Had 2 pos UPTs at home with neg UPT 03/02/20 in ED.    Past Medical History:  Diagnosis Date  . ADHD   . Insomnia   . Mood disorder ([facility])   . Renal disorder    kdiney stones    History reviewed. No pertinent surgical history.  Family History  Problem Relation Age of Onset  . Cancer Paternal Grandfather        lung    Social History   Socioeconomic History  . Marital status: Single    Spouse name: Not on file  . Number of children: Not on file  . Years of education: Not on file  . Highest education level: Not on file  Occupational History  . Not on file  Tobacco Use  . Smoking status: Current Every Day Smoker  . Smokeless tobacco: Never Used  Substance and Sexual Activity  . Alcohol use: Yes  . Drug use: Yes    Types: Marijuana  . Sexual activity: Yes    Birth control/protection: I.U.D.    Comment: Paragard  Other Topics Concern  . Not on file  Social History Narrative  . Not on file   Social Determinants of Health   Financial Resource Strain:   . Difficulty of Paying Living Expenses:   Food Insecurity:   . Worried About Programme researcher, broadcasting/film/video in the Last Year:   . Barista in the Last Year:   Transportation Needs:   . Freight forwarder (Medical):   Marland Kitchen Lack of Transportation (Non-Medical):   Physical Activity:   .  Days of Exercise per Week:   . Minutes of Exercise per Session:   Stress:   . Feeling of Stress :   Social Connections:   . Frequency of Communication with Friends and Family:   . Frequency of Social Gatherings with Friends and Family:   . Attends Religious Services:   . Active Member of Clubs or Organizations:   . Attends Banker Meetings:   Marland Kitchen Marital Status:   Intimate Partner Violence:   . Fear of Current or Ex-Partner:   . Emotionally Abused:   Marland Kitchen Physically Abused:   . Sexually Abused:     Outpatient Medications Prior to Visit  Medication Sig Dispense Refill  . escitalopram (LEXAPRO) 5 MG tablet Take 5 mg by mouth daily.    Marland Kitchen dicyclomine (BENTYL) 20 MG tablet Take 1 tablet (20 mg total) by mouth every 6 (six) hours. 20 tablet 0  . ondansetron (ZOFRAN ODT) 4 MG disintegrating tablet Take 1 tablet (4 mg total) by mouth every 8 (eight) hours as needed for nausea or vomiting. 20 tablet 0   Facility-Administered Medications Prior to Visit  Medication Dose Route Frequency Provider Last Rate Last Admin  .  paragard intrauterine copper IUD   Intrauterine Once Brookelyn Gaynor B, PA-C          ROS:  Review of Systems  Constitutional: Negative for fever.  Gastrointestinal: Negative for blood in stool, constipation, diarrhea, nausea and vomiting.  Genitourinary: Positive for dyspareunia, menstrual problem and pelvic pain. Negative for dysuria, flank pain, frequency, hematuria, urgency, vaginal bleeding, vaginal discharge and vaginal pain.  Musculoskeletal: Negative for back pain.  Skin: Negative for rash.  BREAST: No symptoms   OBJECTIVE:   Vitals:  BP 100/70   Ht 5\' 3"  (1.6 m)   Wt 192 lb (87.1 kg)   BMI 34.01 kg/m   Physical Exam Vitals reviewed.  Constitutional:      Appearance: She is well-developed.  Pulmonary:     Effort: Pulmonary effort is normal.  Abdominal:     Palpations: Abdomen is soft.     Tenderness: There is abdominal tenderness in the  left lower quadrant. There is no guarding or rebound.  Genitourinary:    General: Normal vulva.     Pubic Area: No rash.      Labia:        Right: No rash, tenderness or lesion.        Left: No rash, tenderness or lesion.      Vagina: Normal. No vaginal discharge, erythema or tenderness.     Cervix: Normal.     Uterus: Normal. Not enlarged and not tender.      Adnexa: Right adnexa normal and left adnexa normal.       Right: No mass or tenderness.         Left: No mass or tenderness.       Comments: NEG GYN EXAM FOR PAIN; IUD STRINGS IN CX OS Musculoskeletal:        General: Normal range of motion.     Cervical back: Normal range of motion.  Skin:    General: Skin is warm and dry.  Neurological:     General: No focal deficit present.     Mental Status: She is alert and oriented to person, place, and time.  Psychiatric:        Mood and Affect: Mood normal.        Behavior: Behavior normal.        Thought Content: Thought content normal.        Judgment: Judgment normal.   IUD Removal Strings of IUD identified and grasped.  IUD removed without problem with [place] forceps.  Pt tolerated this well.  IUD noted to be intact.   Results: Results for orders placed or performed in visit on 03/13/20 (from the past 24 hour(s))  POCT urine pregnancy     Status: Normal   Collection Time: 03/13/20  2:28 PM  Result Value Ref Range   Preg Test, Ur Negative Negative     Assessment/Plan: Pelvic pain - Plan: POCT urine pregnancy; Neg UPT, neg GYN exam, pos abd exam. Question constipation. Add fiber supp/lots of water. F/u prn.   Screening for STD (sexually transmitted disease) - Plan: Cervicovaginal ancillary only  Encounter for IUD removal--removed without problem  Encounter for initial prescription of vaginal ring hormonal contraceptive - Plan: etonogestrel-ethinyl estradiol (NUVARING) 0.12-0.015 MG/24HR vaginal ring; Nuvaring start today, Condoms for 1 mo. Rx eRxd. F/u prn.    Meds  ordered this encounter  Medications  . etonogestrel-ethinyl estradiol (NUVARING) 0.12-0.015 MG/24HR vaginal ring    Sig: Insert vaginally and leave in place for 3 consecutive weeks, then remove  for 1 week.    Dispense:  3 each    Refill:  3    Order Specific Question:   Supervising Provider    Answer:   Gae Dry [[id number]]      Return if symptoms worsen or fail to improve.  [place] Dani B. Teneshia Hedeen, PA-C 03/13/2020 2:34 PM

## 2020-03-13 NOTE — Patient Instructions (Signed)
I value your feedback and entrusting us with your care. If you get a [place] [place] patient survey, I would appreciate you taking the time to let us know about your experience today. Thank you!  As of December 08, 2019, your lab results will be released to your MyChart immediately, before I even have a chance to see them. Please give me time to review them and contact you if there are any abnormalities. Thank you for your patience.  

## 2020-03-15 LAB — CERVICOVAGINAL ANCILLARY ONLY
Chlamydia: NEGATIVE
Comment: NEGATIVE
Comment: NORMAL
Neisseria Gonorrhea: NEGATIVE

## 2020-04-29 ENCOUNTER — Emergency Department
Admission: EM | Admit: 2020-04-29 | Discharge: 2020-04-29 | Disposition: A | Discharge status: Home / Self Care | Payer: Medicaid Other | Attending: Student | Admitting: Student

## 2020-04-29 ENCOUNTER — Other Ambulatory Visit: Payer: Self-pay

## 2020-04-29 DIAGNOSIS — R112 Nausea with vomiting, unspecified: Secondary | ICD-10-CM | POA: Diagnosis present

## 2020-04-29 DIAGNOSIS — F17210 Nicotine dependence, cigarettes, uncomplicated: Secondary | ICD-10-CM | POA: Diagnosis not present

## 2020-04-29 DIAGNOSIS — Z793 Long term (current) use of hormonal contraceptives: Secondary | ICD-10-CM | POA: Diagnosis not present

## 2020-04-29 DIAGNOSIS — R197 Diarrhea, unspecified: Secondary | ICD-10-CM | POA: Insufficient documentation

## 2020-04-29 DIAGNOSIS — F909 Attention-deficit hyperactivity disorder, unspecified type: Secondary | ICD-10-CM | POA: Insufficient documentation

## 2020-04-29 DIAGNOSIS — Z79899 Other long term (current) drug therapy: Secondary | ICD-10-CM | POA: Insufficient documentation

## 2020-04-29 LAB — COMPREHENSIVE METABOLIC PANEL
ALT: 47 U/L — ABNORMAL HIGH (ref 0–44)
AST: 27 U/L (ref 15–41)
Albumin: 4.5 g/dL (ref 3.5–5.0)
Alkaline Phosphatase: 55 U/L (ref 38–126)
Anion gap: 8 (ref 5–15)
BUN: 13 mg/dL (ref 6–20)
CO2: 22 mmol/L (ref 22–32)
Calcium: 8.9 mg/dL (ref 8.9–10.3)
Chloride: 108 mmol/L (ref 98–111)
Creatinine, Ser: 0.67 mg/dL (ref 0.44–1.00)
GFR calc Af Amer: >60 mL/min (ref >60)
GFR calc non Af Amer: >60 mL/min (ref >60)
Glucose, Bld: 146 mg/dL — ABNORMAL HIGH (ref 70–99)
Potassium: 3.7 mmol/L (ref 3.5–5.1)
Sodium: 138 mmol/L (ref 135–145)
Total Bilirubin: 0.9 mg/dL (ref 0.3–1.2)
Total Protein: 7.2 g/dL (ref 6.5–8.1)

## 2020-04-29 LAB — URINE DRUG SCREEN, QUALITATIVE (ARMC ONLY)
Amphetamines, Ur Screen: NOT DETECTED
Barbiturates, Ur Screen: NOT DETECTED
Benzodiazepine, Ur Scrn: NOT DETECTED
Cannabinoid 50 Ng, Ur ~~LOC~~: POSITIVE — AB
Cocaine Metabolite,Ur ~~LOC~~: NOT DETECTED
MDMA (Ecstasy)Ur Screen: NOT DETECTED
Methadone Scn, Ur: NOT DETECTED
Opiate, Ur Screen: NOT DETECTED
Phencyclidine (PCP) Ur S: NOT DETECTED
Tricyclic, Ur Screen: NOT DETECTED

## 2020-04-29 LAB — URINALYSIS, COMPLETE (UACMP) WITH MICROSCOPIC
Bacteria, UA: NONE SEEN
Bilirubin Urine: NEGATIVE
Glucose, UA: NEGATIVE mg/dL
Hgb urine dipstick: NEGATIVE
Ketones, ur: 5 mg/dL — AB
Leukocytes,Ua: NEGATIVE
Nitrite: NEGATIVE
Protein, ur: NEGATIVE mg/dL
Specific Gravity, Urine: 1.027 (ref 1.005–1.030)
pH: 6 (ref 5.0–8.0)

## 2020-04-29 LAB — POCT PREGNANCY, URINE: Preg Test, Ur: NEGATIVE

## 2020-04-29 LAB — CBC
HCT: 42.5 % (ref 36.0–46.0)
Hemoglobin: 14.6 g/dL (ref 12.0–15.0)
MCH: 30.7 pg (ref 26.0–34.0)
MCHC: 34.4 g/dL (ref 30.0–36.0)
MCV: 89.3 fL (ref 80.0–100.0)
Platelets: 208 10*3/uL (ref 150–400)
RBC: 4.76 MIL/uL (ref 3.87–5.11)
RDW: 11.9 % (ref 11.5–15.5)
WBC: 7 10*3/uL (ref 4.0–10.5)
nRBC: 0 % (ref 0.0–0.2)

## 2020-04-29 LAB — LIPASE, BLOOD: Lipase: 19 U/L (ref 11–51)

## 2020-04-29 MED ORDER — SODIUM CHLORIDE 0.9 % IV BOLUS
1000.0000 mL | Freq: Once | INTRAVENOUS | Status: AC
  Administered 2020-04-29: 1000 mL via INTRAVENOUS

## 2020-04-29 MED ORDER — ONDANSETRON HCL 4 MG PO TABS
4.0000 mg | ORAL_TABLET | Freq: Three times a day (TID) | ORAL | 0 refills | Status: AC | PRN
Start: 2020-04-29 — End: 2020-05-06

## 2020-04-29 MED ORDER — ONDANSETRON HCL 4 MG/2ML IJ SOLN
4.0000 mg | Freq: Once | INTRAMUSCULAR | Status: AC
  Administered 2020-04-29: 4 mg via INTRAVENOUS
  Filled 2020-04-29: qty 2

## 2020-04-29 NOTE — ED Notes (Signed)
Pt verbalized understanding of discharge instructions. NAD at this time. 

## 2020-04-29 NOTE — Discharge Instructions (Signed)
Thank you for letting us take care of you in the emergency department today.   Please continue to take any regular, prescribed medications.   New medications we have prescribed:  Zofran, anti nausea  Please follow up with: Your primary care doctor to review your ER visit and follow up on your symptoms.    Please return to the ER for any new or worsening symptoms.

## 2020-04-29 NOTE — ED Provider Notes (Addendum)
[facility] Emergency Department Provider Note  ____________________________________________   First MD Initiated Contact with Patient 04/29/20 [phone]     (approximate)  I have reviewed the triage vital signs and the nursing notes.  History  Chief Complaint Emesis and Diarrhea    HPI Natasha Hayes is a 19 y.o. female history as below who presents to the emergency department for nausea, vomiting, diarrhea.  Patient states she has been having vomiting and diarrhea all day yesterday and through the night.  States symptoms started after she ate out at [facility], and felt like she ate something bad/not well cooked.  No blood in the emesis or the stool.  She states she has had "too many episodes to count".  Denies any abdominal Hayes aside from expected cramping with vomiting. No Hayes at present. No fevers.  No dysuria.  No sick contacts.  Vaccinated against COVID-19. Her family member gave her an antinausea suppository, which provided some mild relief.  No aggravating components.   Past Medical Hx Past Medical History:  Diagnosis Date  . ADHD   . Insomnia   . Mood disorder ([facility])   . Renal disorder    kdiney stones    Problem List There are no problems to display for this patient.   Past Surgical Hx No past surgical history on file.  Medications Prior to Admission medications   Medication Sig Start Date End Date Taking? Authorizing Provider  escitalopram (LEXAPRO) 5 MG tablet Take 5 mg by mouth daily. 02/09/20   [provider]  etonogestrel-ethinyl estradiol (NUVARING) 0.12-0.015 MG/24HR vaginal ring Insert vaginally and leave in place for 3 consecutive weeks, then remove for 1 week. 03/13/20   Copland, Ilona Sorrel, PA-C    Allergies Patient has no known allergies.  Family Hx Family History  Problem Relation Age of Onset  . Cancer Paternal Grandfather        lung    Social Hx Social History   Tobacco Use  . Smoking status: Current Every Day  Smoker  . Smokeless tobacco: Never Used  Substance Use Topics  . Alcohol use: Yes  . Drug use: Yes    Types: Marijuana     Review of Systems  Constitutional: Negative for fever. Negative for chills. Eyes: Negative for visual changes. ENT: Negative for sore throat. Cardiovascular: Negative for chest Hayes. Respiratory: Negative for shortness of breath. Gastrointestinal: Positive for nausea, vomiting, diarrhea. Genitourinary: Negative for dysuria. Musculoskeletal: Negative for leg swelling. Skin: Negative for rash. Neurological: Negative for headaches.   Physical Exam  Vital Signs: ED Triage Vitals  Enc Vitals Group     BP 04/29/20 0414 121/70     Pulse Rate 04/29/20 0414 (!) 106     Resp 04/29/20 0414 20     Temp 04/29/20 0414 98.5 F (36.9 C)     Temp Source 04/29/20 0414 Oral     SpO2 04/29/20 0414 99 %     Weight 04/29/20 0413 195 lb (88.5 kg)     Height 04/29/20 0413 5\' 3"  (1.6 m)     Head Circumference --      Peak Flow --      Hayes Score --      Hayes Loc --      Hayes Edu? --      Excl. in [place]? --     Constitutional: Alert and oriented. Well appearing. NAD.  Head: Normocephalic. Atraumatic. Eyes: Conjunctivae clear. Sclera anicteric. Pupils equal and symmetric. Nose: No masses or lesions.  No congestion or rhinorrhea. Mouth/Throat: Wearing mask.  MM slightly dry. Neck: No stridor. Trachea midline.  Cardiovascular: Normal rate, regular rhythm. Extremities well perfused. Respiratory: Normal respiratory effort.  Lungs CTAB. Gastrointestinal: Soft. Non-distended. Non-tender.  Genitourinary: Deferred. Musculoskeletal: No lower extremity edema. No deformities. Neurologic:  Normal speech and language. No gross focal or lateralizing neurologic deficits are appreciated.  Skin: Skin is warm, dry and intact. No rash noted. Psychiatric: Mood and affect are appropriate for situation.   Procedures  Procedure(s) performed (including critical  care):  Procedures   Initial Impression / Assessment and Plan / MDM / ED Course  19 y.o. female who presents to the ED for nausea, vomiting, diarrhea, in setting of eating out at a restaurant.  Ddx: food poisoning, gastroenteritis, pregnancy, GI virus.  Less likely COVID as patient is fully vaccinated  Will plan for labs, urine studies, fluids, IV antiemetics and reassess.  Lab work unremarkable, electrolytes without actionable derangements.  Urine negative for infection, negative pregnancy.  Small amount of ketones consistent with her GI process, mild dehydration. UDS + cannabinoids, which may be contributing to her symptoms.  Received fluids and antiemetics and tolerating by mouth in the emergency department.  As such, feel patient is stable for discharge with outpatient follow-up.  Will provide Rx for Zofran.  Given return precautions.  Patient voices understanding and is comfortable with the plan and discharge.  _______________________________   As part of my medical decision making I have reviewed available labs, radiology tests, reviewed old records/performed chart review, obtained additional history from family (updated mom via phone of plan of care and results).    Final Clinical Impression(s) / ED Diagnosis  Final diagnoses:  Nausea vomiting and diarrhea       Note:  This document was prepared using Dragon voice recognition software and may include unintentional dictation errors.     Lilia Pro., MD 04/29/20 0930

## 2020-04-29 NOTE — ED Triage Notes (Signed)
Patient reports nausea, vomiting and diarrhea for several days.

## 2020-07-12 ENCOUNTER — Ambulatory Visit: Payer: Medicaid Other | Admitting: Family Medicine

## 2020-08-21 ENCOUNTER — Ambulatory Visit: Payer: Medicaid Other | Admitting: Adult Health

## 2020-09-25 ENCOUNTER — Ambulatory Visit: Payer: Medicaid Other | Admitting: Family Medicine

## 2020-09-25 ENCOUNTER — Other Ambulatory Visit: Payer: Self-pay

## 2020-09-27 ENCOUNTER — Telehealth: Payer: Self-pay

## 2020-09-27 NOTE — Telephone Encounter (Signed)
On September 28th, patient came into office. I was back at my desk and chassidy came back from calling her patient. She asked." Do you have a new patient checking in?" I answered, "Yes, why?" She stated, " She is being loud at the front desk." Shortly after, I received word from the front that they needed me up there. Wendie Simmer showed me the card that Mikes presented to her. It had a name of [facility], or something similar on it. Then Wendie Simmer showed me in the computer, it pulled up as [facility]. I explained to the patient we had both tried to call her on the 27th and could not leave a message. I also let her know that we both sat through her message, which she grinned behind her mask. I went on further to tell her I had informed the case worker of the card issue and if we could've talked to her or left a message, we were going to remind her about the card. She said the case worker never told her anything. She left and approximately 5-10 minutes later, a Child psychotherapist or case worker came back with her. This was not the same lady I had spoke to on the phone several weeks ago. As I was talking to, I think she said her name was Dayami, Taitt was pacing around behind her stating, "I don't have a problem with my card in the f&^#ing urgent care." Demetria did turn around and say, " This is not the urgent care, hold on I'm talking." Demetria then looked at [facility] and I and crossed her eyes, as if she had had enough. The patient and Demetria were told "We are not saying we can't see her, but what I am saying is that we wouldn't be able to prescribe medications, do any referrals or labs for her until her card is fixed with our name on it." She asked for a card or telephone number with our office manager's information on it. Marchelle Folks handed her the card and patient stormed out of the office on her own still using foul language that made patients in the lobby feel uncomfortable. Once I got back to the back,  Chassidy had came out of her patient's room and said that her patient complained about [place]. Stating that "She made me feel uncomfortable and yoou shouldn't talk to anybody like that." This patient was out in the lobby and witnessed the "check in part" with [place] and Marchelle Folks.

## 2020-10-01 ENCOUNTER — Telehealth: Payer: Self-pay

## 2020-10-01 NOTE — Telephone Encounter (Signed)
Called to speak to patients Social Worker Haze Justin in regards to the patient not returning to the office to establish care.  Informed the Social Worker that due to the previous outburst in the waiting room & the potential for future outburst as the Social Worker stated may happen that we as a clinic & Dr Yetta Barre as a physician were not comfortable taking the patient on.  I gave the Child psychotherapist several other options in [place] to try to get the patient scheduled at.

## 2020-10-10 ENCOUNTER — Ambulatory Visit (INDEPENDENT_AMBULATORY_CARE_PROVIDER_SITE_OTHER): Payer: Medicaid Other

## 2020-10-10 ENCOUNTER — Other Ambulatory Visit: Payer: Self-pay | Admitting: Advanced Practice Midwife

## 2020-10-10 ENCOUNTER — Ambulatory Visit (INDEPENDENT_AMBULATORY_CARE_PROVIDER_SITE_OTHER): Payer: Medicaid Other | Admitting: Advanced Practice Midwife

## 2020-10-10 ENCOUNTER — Encounter: Payer: Self-pay | Admitting: Advanced Practice Midwife

## 2020-10-10 ENCOUNTER — Other Ambulatory Visit: Payer: Self-pay

## 2020-10-10 VITALS — BP 119/79 | HR 78 | Ht 63.0 in | Wt 195.0 lb

## 2020-10-10 DIAGNOSIS — O209 Hemorrhage in early pregnancy, unspecified: Secondary | ICD-10-CM | POA: Diagnosis not present

## 2020-10-10 DIAGNOSIS — Z62810 Personal history of physical and sexual abuse in childhood: Secondary | ICD-10-CM

## 2020-10-10 DIAGNOSIS — O99891 Other specified diseases and conditions complicating pregnancy: Secondary | ICD-10-CM

## 2020-10-10 DIAGNOSIS — R102 Pelvic and perineal pain: Secondary | ICD-10-CM

## 2020-10-10 DIAGNOSIS — M5459 Other low back pain: Secondary | ICD-10-CM

## 2020-10-10 DIAGNOSIS — R109 Unspecified abdominal pain: Secondary | ICD-10-CM | POA: Diagnosis not present

## 2020-10-10 DIAGNOSIS — R350 Frequency of micturition: Secondary | ICD-10-CM

## 2020-10-10 DIAGNOSIS — Z3A01 Less than 8 weeks gestation of pregnancy: Secondary | ICD-10-CM

## 2020-10-10 DIAGNOSIS — Z8742 Personal history of other diseases of the female genital tract: Secondary | ICD-10-CM | POA: Diagnosis not present

## 2020-10-10 LAB — POCT URINALYSIS DIPSTICK
Glucose, UA: NEGATIVE
Leukocytes, UA: NEGATIVE
Protein, UA: NEGATIVE
Spec Grav, UA: 1.01 (ref 1.010–1.025)
Urobilinogen, UA: 0.2 E.U./dL
pH, UA: 7 (ref 5.0–8.0)

## 2020-10-10 NOTE — Progress Notes (Signed)
Patient ID: Natasha Hayes, female   DOB: 2001-09-10, 19 y.o.   MRN: [id number]  Reason for Consult: Vaginal Bleeding (positive pregnancy test, approx a week ago had "clumps of blood" heavy flow, stopped the second day, had abdonmial pain ever since as well as in mid to lower back. having problems holding her pee, pain so severe wants to throw up )    Subjective:  HPI:  Natasha Hayes is a 19 y.o. female being seen for pelvic and right side back pain. The pain is sharp and throbbing and she describes it as severe. She had a positive home urine pregnancy test 1 week ago. On Sunday she had bleeding and was changing tampons every 2 hours. The bleeding had stopped by Monday and since then she has had the mid and right side pelvic pain and right side back pain. She denies fever. She has had urinary frequency and is able to empty her bladder. She has a history of left ovarian cyst.   This is an unplanned pregnancy and she mentions that she plans to quit using marijuana if pregnancy is viable. GA: [redacted]w[redacted]d by LMP 08/31/20.    Past Medical History:  Diagnosis Date  . ADHD   . Insomnia   . Mood disorder (HCC)   . Renal disorder    kdiney stones   Family History  Problem Relation Age of Onset  . Cancer Paternal Grandfather        lung   History reviewed. No pertinent surgical history.  Short Social History:  Social History   Tobacco Use  . Smoking status: Current Every Day Smoker  . Smokeless tobacco: Never Used  Substance Use Topics  . Alcohol use: Not Currently    No Known Allergies  Current Outpatient Medications  Medication Sig Dispense Refill  . escitalopram (LEXAPRO) 10 MG tablet Take 10 mg by mouth daily.     No current facility-administered medications for this visit.    Review of Systems  Constitutional: Negative for chills and fever.  HENT: Negative for congestion, ear discharge, ear pain, hearing loss, sinus pain and sore throat.   Eyes: Negative for blurred vision and  double vision.  Respiratory: Negative for cough, shortness of breath and wheezing.   Cardiovascular: Negative for chest pain, palpitations and leg swelling.  Gastrointestinal: Positive for abdominal pain. Negative for blood in stool, constipation, diarrhea, heartburn, melena, nausea and vomiting.  Genitourinary: Positive for frequency. Negative for dysuria, flank pain, hematuria and urgency.       Positive for lower pelvic pain, vaginal bleeding 3 days ago  Musculoskeletal: Positive for back pain. Negative for joint pain and myalgias.  Skin: Negative for itching and rash.  Neurological: Negative for dizziness, tingling, tremors, sensory change, speech change, focal weakness, seizures, loss of consciousness, weakness and headaches.  Endo/Heme/Allergies: Negative for environmental allergies. Does not bruise/bleed easily.  Psychiatric/Behavioral: Negative for depression, hallucinations, memory loss, substance abuse and suicidal ideas. The patient is not nervous/anxious and does not have insomnia.         Objective:  Objective   Vitals:   10/10/20 1442  BP: 119/79  Pulse: 78  SpO2: 98%  Weight: 195 lb (88.5 kg)  Height: 5\' 3"  (1.6 m)   Body mass index is 34.54 kg/m. Constitutional: Well nourished, well developed female in no acute distress.  HEENT: normal Skin: Warm and dry.  Cardiovascular: Regular rate and rhythm.   Extremity: no edema  Respiratory: Clear to auscultation bilateral. Normal respiratory effort Abdomen: mildly tender  to palpation in lower abdomen Back: right side flank pain Neuro: DTRs 2+, Cranial nerves grossly intact Psych: Alert and Oriented x3. No memory deficits. Normal mood and affect.  MS: normal gait, normal bilateral lower extremity ROM/strength/stability.   Patient Name: Natasha Hayes DOB: [birth year] MRN: [id number]   ULTRASOUND REPORT  Location: [facility] Date of Service: 10/10/2020   Indications:Bleeding and Pain in First Trimester,  Placement   Findings:  No intra/extrauterine pregnancy seen today.  The endometrium is thick, measuring 16.1 mm.   There is scant anechoic free fluid in the right adnexa measuring 25.4 x 8.8 x 17.9 mm.  Right Ovary is normal in appearance. The right corpus luteum measures 12.5 x 10.7 x 11.4 mm.  Left Ovary is normal appearance. Corpus luteal cyst:  Right ovary Survey of the adnexa demonstrates no adnexal masses.  Impression: 1. No intra/extrauterine pregnancy is seen today.  2. Thick endometrium. 3. Right ovarian corpus luteum.   Recommendations: 1.Clinical correlation with the patient's History and Physical Exam.  Deanna Artis, RT  Assessment/Plan:     19 y.o. G0 P0 with right side pelvic pain, right side back pain, urinary frequency  Gyn ultrasound to rule out ectopic pregnancy Urine culture  Follow up as needed   Tresea Mall CNM [facility] 10/10/2020, 5:02 PM

## 2020-10-12 LAB — URINE CULTURE

## 2020-10-30 ENCOUNTER — Ambulatory Visit (INDEPENDENT_AMBULATORY_CARE_PROVIDER_SITE_OTHER): Payer: Medicaid Other | Admitting: Hospice and Palliative Medicine

## 2020-10-30 ENCOUNTER — Encounter: Payer: Self-pay | Admitting: Hospice and Palliative Medicine

## 2020-10-30 ENCOUNTER — Other Ambulatory Visit: Payer: Self-pay

## 2020-10-30 DIAGNOSIS — R7301 Impaired fasting glucose: Secondary | ICD-10-CM | POA: Diagnosis not present

## 2020-10-30 DIAGNOSIS — G8929 Other chronic pain: Secondary | ICD-10-CM | POA: Diagnosis not present

## 2020-10-30 DIAGNOSIS — M545 Low back pain, unspecified: Secondary | ICD-10-CM

## 2020-10-30 DIAGNOSIS — M5441 Lumbago with sciatica, right side: Secondary | ICD-10-CM | POA: Diagnosis not present

## 2020-10-30 DIAGNOSIS — Z7689 Persons encountering health services in other specified circumstances: Secondary | ICD-10-CM

## 2020-10-30 LAB — POCT URINALYSIS DIPSTICK
Bilirubin, UA: NEGATIVE
Blood, UA: NEGATIVE
Glucose, UA: NEGATIVE
Ketones, UA: NEGATIVE
Leukocytes, UA: NEGATIVE
Nitrite, UA: NEGATIVE
Protein, UA: NEGATIVE
Spec Grav, UA: >=1.03 — AB (ref 1.010–1.025)
Urobilinogen, UA: 0.2 E.U./dL
pH, UA: 5 (ref 5.0–8.0)

## 2020-10-30 LAB — POCT GLYCOSYLATED HEMOGLOBIN (HGB A1C): Hemoglobin A1C: 5.5 % (ref 4.0–5.6)

## 2020-10-30 LAB — GLUCOSE, POCT (MANUAL RESULT ENTRY): POC Glucose: 117 mg/dl — AB (ref 70–99)

## 2020-10-30 NOTE — Progress Notes (Signed)
[facility] [address] [place], [place] [address]  Internal MEDICINE  Office Visit Note  Patient Name: Natasha Hayes  [id number]  [id number]  Date of Service: 11/01/2020   Complaints/HPI Pt is here for establishment of PCP. Chief Complaint  Patient presents with   New Patient (Initial Visit)   ADHD   Leg Pain    left leg    Back Pain   HPI  Patient is here to establish PCP care She has not been seen by PCP lately or had a physical exam Comes to office today for PCP establishment to have her blood work checked, unsure if she has ever been told she has diabetes but would like to make sure  She is followed by psychiatry for ADHD and mood disorder/bipolar disorder-currently taking Strattera, followed by [facility]  She does report that she currently smokes marijuana almost daily to help with her anxiety, she reports in the past to heavy drinking but has since stopped completely due to bad experiences Not currently working or in school, receives disability, will be moving into her own apartment soon, plans to have her boyfriend move in with her Currently in a stable relationship--her boyfriend has mentioned wanting to start planning to have children, she is unsure as to if she is ready to take that next step  Complains today of lower back pain that radiates down her left leg that causes numbness--this has been ongoing for several months, pain and numbness are intermittent, becomes more aggravated when she stands or walks for long periods of time--would like to be checked for a UTI today, no complaints of urinary symptoms at this time  No issues with eating, swallowing, urination or BM's. Sleeps well each night, feels rested during the day.  Current Medication: Outpatient Encounter Medications as of 10/30/2020  Medication Sig   atomoxetine (STRATTERA) 80 MG capsule Take 80 mg by mouth every morning.   [DISCONTINUED] escitalopram (LEXAPRO) 10 MG tablet Take 10 mg by mouth  daily. (Patient not taking: Reported on 10/30/2020)   No facility-administered encounter medications on file as of 10/30/2020.    Surgical History: History reviewed. No pertinent surgical history.  Medical History: Past Medical History:  Diagnosis Date   ADHD    Insomnia    Mood disorder (HCC)    Renal disorder    kdiney stones    Family History: Family History  Problem Relation Age of Onset   Cancer Paternal Grandfather        lung   Depression Paternal Grandfather     Social History   Socioeconomic History   Marital status: Single    Spouse name: Not on file   Number of children: Not on file   Years of education: Not on file   Highest education level: Not on file  Occupational History   Not on file  Tobacco Use   Smoking status: Current Every Day Smoker    Packs/day: 1.00    Types: Cigarettes   Smokeless tobacco: Never Used  Vaping Use   Vaping Use: Never used  Substance and Sexual Activity   Alcohol use: Not Currently   Drug use: Yes    Types: Marijuana   Sexual activity: Yes    Comment: nuva ring   Other Topics Concern   Not on file  Social History Narrative   Not on file   Social Determinants of Health   Financial Resource Strain:    Difficulty of Paying Living Expenses: Not on file  Food Insecurity:  Worried About Programme researcher, broadcasting/film/video in the Last Year: Not on file   [employer] of Food in the Last Year: Not on file  Transportation Needs:    Lack of Transportation (Medical): Not on file   Lack of Transportation (Non-Medical): Not on file  Physical Activity:    Days of Exercise per Week: Not on file   Minutes of Exercise per Session: Not on file  Stress:    Feeling of Stress : Not on file  Social Connections:    Frequency of Communication with Friends and Family: Not on file   Frequency of Social Gatherings with Friends and Family: Not on file   Attends Religious Services: Not on file   Active Member of Clubs or  Organizations: Not on file   Attends Banker Meetings: Not on file   Marital Status: Not on file  Intimate Partner Violence:    Fear of Current or Ex-Partner: Not on file   Emotionally Abused: Not on file   Physically Abused: Not on file   Sexually Abused: Not on file   Review of Systems  Constitutional: Negative for chills, diaphoresis and fatigue.  HENT: Negative for ear pain, postnasal drip and sinus pressure.   Eyes: Negative for photophobia, discharge, redness, itching and visual disturbance.  Respiratory: Negative for cough, shortness of breath and wheezing.   Cardiovascular: Negative for chest pain, palpitations and leg swelling.  Gastrointestinal: Negative for abdominal pain, constipation, diarrhea, nausea and vomiting.  Genitourinary: Negative for dysuria and flank pain.  Musculoskeletal: Positive for back pain. Negative for arthralgias, gait problem and neck pain.       Lower back pain that radiates down left leg causing numbness  Skin: Negative for color change.  Allergic/Immunologic: Negative for environmental allergies and food allergies.  Neurological: Negative for dizziness and headaches.  Hematological: Does not bruise/bleed easily.  Psychiatric/Behavioral: Negative for agitation, behavioral problems (depression) and hallucinations.    Vital Signs: BP 120/80    Pulse 88    Temp 97.6 F (36.4 C)    Resp 16    Ht 5\' 3"  (1.6 m)    Wt 197 lb (89.4 kg)    SpO2 98%    BMI 34.90 kg/m    Physical Exam Vitals reviewed.  Constitutional:      Appearance: Normal appearance. She is obese.  Cardiovascular:     Rate and Rhythm: Normal rate and regular rhythm.     Pulses: Normal pulses.     Heart sounds: Normal heart sounds.  Pulmonary:     Effort: Pulmonary effort is normal.     Breath sounds: Normal breath sounds.  Abdominal:     General: Abdomen is flat.  Musculoskeletal:        General: Normal range of motion.     Cervical back: Normal range of  motion.  Skin:    General: Skin is warm.  Neurological:     General: No focal deficit present.     Mental Status: She is alert and oriented to person, place, and time. Mental status is at baseline.  Psychiatric:        Mood and Affect: Mood normal.        Behavior: Behavior normal.        Thought Content: Thought content normal.     Assessment/Plan: 1. Encounter to establish care with new doctor Will review routine baseline labs and adjust plan of care accordingly Will discuss labs at her upcoming CPE appointment - CBC w/Diff/Platelet -  Comprehensive Metabolic Panel (CMET) - Lipid Panel With LDL/HDL Ratio - TSH + free T4  2. Chronic right-sided low back pain with right-sided sciatica Requesting that we test her for UTI due to her back pain Discussed that back pain that has been ongoing for several months is unlikely caused by UTI Possible muscle strain--kidney stone? Urine sample today shows no signs of UTI, RBC not seen so unlikely caused by kidney stone Back pain likely MSK related--advised to continue with ambulation as tolerated and may apply ice/heat to affected area, make take acetaminophen/ibuprofen for pain as needed - POCT Urinalysis Dipstick  3. Impaired fasting glucose A1C 5.5 today, no evidence of diabetes Will continue with routine monitoring - POCT HgB A1C - POCT Glucose (CBG)  General Counseling: Arwyn verbalizes understanding of the findings of todays visit and agrees with plan of treatment. I have discussed any further diagnostic evaluation that may be needed or ordered today. We also reviewed her medications today. she has been encouraged to call the office with any questions or concerns that should arise related to todays visit.  Orders Placed This Encounter  Procedures   CBC w/Diff/Platelet   Comprehensive Metabolic Panel (CMET)   Lipid Panel With LDL/HDL Ratio   TSH + free T4   POCT HgB A1C   POCT Urinalysis Dipstick   POCT Glucose (CBG)       Time spent: 30 Minutes Time spent includes review of chart, medications, test results and follow-up plan with the patient.  This patient was seen by Leeanne Deed AGNP-C in Collaboration with Dr Lyndon Code as a part of collaborative care agreement  Leeanne Deed [facility] Internal Medicine

## 2020-10-31 ENCOUNTER — Other Ambulatory Visit: Payer: Self-pay | Admitting: Hospice and Palliative Medicine

## 2020-10-31 DIAGNOSIS — R748 Abnormal levels of other serum enzymes: Secondary | ICD-10-CM

## 2020-10-31 LAB — COMPREHENSIVE METABOLIC PANEL
ALT: 64 IU/L — ABNORMAL HIGH (ref 0–32)
AST: 29 IU/L (ref 0–40)
Albumin/Globulin Ratio: 2.3 — ABNORMAL HIGH (ref 1.2–2.2)
Albumin: 4.5 g/dL (ref 3.9–5.0)
Alkaline Phosphatase: 58 IU/L (ref 42–106)
BUN/Creatinine Ratio: 14 (ref 9–23)
BUN: 10 mg/dL (ref 6–20)
Bilirubin Total: 0.2 mg/dL (ref 0.0–1.2)
CO2: 22 mmol/L (ref 20–29)
Calcium: 9.3 mg/dL (ref 8.7–10.2)
Chloride: 104 mmol/L (ref 96–106)
Creatinine, Ser: 0.71 mg/dL (ref 0.57–1.00)
GFR calc Af Amer: 142 mL/min/{1.73_m2} (ref >59)
GFR calc non Af Amer: 123 mL/min/{1.73_m2} (ref >59)
Globulin, Total: 2 g/dL (ref 1.5–4.5)
Glucose: 98 mg/dL (ref 65–99)
Potassium: 4.6 mmol/L (ref 3.5–5.2)
Sodium: 138 mmol/L (ref 134–144)
Total Protein: 6.5 g/dL (ref 6.0–8.5)

## 2020-10-31 LAB — LIPID PANEL WITH LDL/HDL RATIO
Cholesterol, Total: 134 mg/dL (ref 100–199)
HDL: 35 mg/dL — ABNORMAL LOW (ref >39)
LDL Chol Calc (NIH): 74 mg/dL (ref 0–99)
LDL/HDL Ratio: 2.1 ratio (ref 0.0–3.2)
Triglycerides: 140 mg/dL (ref 0–149)
VLDL Cholesterol Cal: 25 mg/dL (ref 5–40)

## 2020-10-31 LAB — CBC WITH DIFFERENTIAL/PLATELET
Basophils Absolute: 0 10*3/uL (ref 0.0–0.2)
Basos: 1 %
EOS (ABSOLUTE): 0.3 10*3/uL (ref 0.0–0.4)
Eos: 4 %
Hematocrit: 41.1 % (ref 34.0–46.6)
Hemoglobin: 14.1 g/dL (ref 11.1–15.9)
Immature Grans (Abs): 0 10*3/uL (ref 0.0–0.1)
Immature Granulocytes: 0 %
Lymphocytes Absolute: 2.5 10*3/uL (ref 0.7–3.1)
Lymphs: 34 %
MCH: 31 pg (ref 26.6–33.0)
MCHC: 34.3 g/dL (ref 31.5–35.7)
MCV: 90 fL (ref 79–97)
Monocytes Absolute: 0.6 10*3/uL (ref 0.1–0.9)
Monocytes: 9 %
Neutrophils Absolute: 4 10*3/uL (ref 1.4–7.0)
Neutrophils: 52 %
Platelets: 240 10*3/uL (ref 150–450)
RBC: 4.55 x10E6/uL (ref 3.77–5.28)
RDW: 12.6 % (ref 11.7–15.4)
WBC: 7.5 10*3/uL (ref 3.4–10.8)

## 2020-10-31 LAB — TSH+FREE T4
Free T4: 1.1 ng/dL (ref 0.82–1.77)
TSH: 0.859 u[IU]/mL (ref 0.450–4.500)

## 2020-10-31 NOTE — Progress Notes (Signed)
Labs reviewed, will proceed with abdominal US due to elevation in AST at 64. All other labs WNL.

## 2020-11-01 ENCOUNTER — Telehealth: Payer: Self-pay

## 2020-11-01 ENCOUNTER — Encounter: Payer: Self-pay | Admitting: Hospice and Palliative Medicine

## 2020-11-01 NOTE — Telephone Encounter (Signed)
-----   Message from Theotis Burrow, NP sent at 10/31/2020  6:57 PM EDT ----- Please call patient and let her know I have ordered an abdominal US due to her recent labs. She has a elevated liver enzymes, will do [place] to look more closely at liver. All other labs are stable.  Thanks :)

## 2020-11-01 NOTE — Telephone Encounter (Signed)
Spoke to pt and informed her of lab results and that we needed to schedule her for ultrasound of liver.  Pt has appt scheduled for 11/16/20.  Informed her of all other labs were stable.

## 2020-11-05 ENCOUNTER — Ambulatory Visit: Payer: Medicaid Other | Admitting: Obstetrics and Gynecology

## 2020-11-07 ENCOUNTER — Ambulatory Visit (INDEPENDENT_AMBULATORY_CARE_PROVIDER_SITE_OTHER): Payer: Medicaid Other | Admitting: Obstetrics and Gynecology

## 2020-11-07 ENCOUNTER — Other Ambulatory Visit: Payer: Self-pay

## 2020-11-07 ENCOUNTER — Encounter: Payer: Self-pay | Admitting: Obstetrics and Gynecology

## 2020-11-07 VITALS — BP 120/70 | Ht 63.0 in | Wt 195.4 lb

## 2020-11-07 DIAGNOSIS — Z30017 Encounter for initial prescription of implantable subdermal contraceptive: Secondary | ICD-10-CM | POA: Diagnosis not present

## 2020-11-07 DIAGNOSIS — Z0000 Encounter for general adult medical examination without abnormal findings: Secondary | ICD-10-CM

## 2020-11-07 DIAGNOSIS — Z23 Encounter for immunization: Secondary | ICD-10-CM

## 2020-11-07 DIAGNOSIS — Z01419 Encounter for gynecological examination (general) (routine) without abnormal findings: Secondary | ICD-10-CM | POA: Diagnosis not present

## 2020-11-07 LAB — POCT URINE PREGNANCY: Preg Test, Ur: NEGATIVE
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
## 2020-11-07 NOTE — Progress Notes (Signed)
Pt states she's here annual exam and Nexplanon today.

## 2020-11-07 NOTE — Patient Instructions (Signed)
Institute [employer] Recommended Dietary Allowances for Calcium and Vitamin D  Age (yr) Calcium Recommended Dietary Allowance (mg/day) Vitamin D Recommended Dietary Allowance (international units/day)  9-18 1,300 600  19-50 1,000 600  51-70 1,200 600  71 and older 1,200 800  Data from [employer]. Dietary reference intakes: calcium, vitamin D. [place], [place]: National Academies Press; 2011.     Exercising to Stay Healthy To become healthy and stay healthy, it is recommended that you do moderate-intensity and vigorous-intensity exercise. You can tell that you are exercising at a moderate intensity if your heart starts beating faster and you start breathing faster but can still hold a conversation. You can tell that you are exercising at a vigorous intensity if you are breathing much harder and faster and cannot hold a conversation while exercising. Exercising regularly is important. It has many health benefits, such as:  Improving overall fitness, flexibility, and endurance.  Increasing bone density.  Helping with weight control.  Decreasing body fat.  Increasing muscle strength.  Reducing stress and tension.  Improving overall health. How often should I exercise? Choose an activity that you enjoy, and set realistic goals. Your health care provider can help you make an activity plan that works for you. Exercise regularly as told by your health care provider. This may include:  Doing strength training two times a week, such as: ? Lifting weights. ? Using resistance bands. ? Push-ups. ? Sit-ups. ? Yoga.  Doing a certain intensity of exercise for a given amount of time. Choose from these options: ? A total of 150 minutes of moderate-intensity exercise every week. ? A total of 75 minutes of vigorous-intensity exercise every week. ? A mix of moderate-intensity and vigorous-intensity exercise every week. Children, pregnant women, people who have not exercised  regularly, people who are overweight, and older adults may need to talk with a health care provider about what activities are safe to do. If you have a medical condition, be sure to talk with your health care provider before you start a new exercise program. What are some exercise ideas? Moderate-intensity exercise ideas include:  Walking 1 mile (1.6 km) in about 15 minutes.  Biking.  Hiking.  Golfing.  Dancing.  Water aerobics. Vigorous-intensity exercise ideas include:  Walking 4.5 miles (7.2 km) or more in about 1 hour.  Jogging or running 5 miles (8 km) in about 1 hour.  Biking 10 miles (16.1 km) or more in about 1 hour.  Lap swimming.  Roller-skating or in-line skating.  Cross-country skiing.  Vigorous competitive sports, such as football, basketball, and soccer.  Jumping rope.  Aerobic dancing. What are some everyday activities that can help me to get exercise?  Yard work, such as: ? Pushing a lawn mower. ? Raking and bagging leaves.  Washing your car.  Pushing a stroller.  Shoveling snow.  Gardening.  Washing windows or floors. How can I be more active in my day-to-day activities?  Use stairs instead of an elevator.  Take a walk during your lunch break.  If you drive, park your car farther away from your work or school.  If you take public transportation, get off one stop early and walk the rest of the way.  Stand up or walk around during all of your indoor phone calls.  Get up, stretch, and walk around every 30 minutes throughout the day.  Enjoy exercise with a friend. Support to continue exercising will help you keep a regular routine of activity. What guidelines can   I follow while exercising?  Before you start a new exercise program, talk with your health care provider.  Do not exercise so much that you hurt yourself, feel dizzy, or get very short of breath.  Wear comfortable clothes and wear shoes with good support.  Drink plenty of  water while you exercise to prevent dehydration or heat stroke.  Work out until your breathing and your heartbeat get faster. Where to find more information  [place] Department of Health and Human Services: www.hhs.gov  Centers for Disease Control and Prevention (CDC): www.cdc.gov Summary  Exercising regularly is important. It will improve your overall fitness, flexibility, and endurance.  Regular exercise also will improve your overall health. It can help you control your weight, reduce stress, and improve your bone density.  Do not exercise so much that you hurt yourself, feel dizzy, or get very short of breath.  Before you start a new exercise program, talk with your health care provider. This information is not intended to replace advice given to you by your health care provider. Make sure you discuss any questions you have with your health care provider. Document Revised: 11/27/2017 Document Reviewed: 11/05/2017 Elsevier Patient Education  2020 Else[employer] Inc.   Budget-Friendly Healthy Eating There are many ways to save money at the grocery store and continue to eat healthy. You can be successful if you:  Plan meals according to your budget.  Make a grocery list and only purchase food according to your grocery list.  Prepare food yourself. What are tips for following this plan?  Reading food labels  Compare food labels between brand name foods and the store brand. Often the nutritional value is the same, but the store brand is lower cost.  Look for products that do not have added sugar, fat, or salt (sodium). These often cost the same but are healthier for you. Products may be labeled as: ? Sugar-free. ? Nonfat. ? Low-fat. ? Sodium-free. ? Low-sodium.  Look for lean ground beef labeled as at least 92% lean and 8% fat. Shopping  Buy only the items on your grocery list and go only to the areas of the store that have the items on your list.  Use coupons only for foods  and brands you normally buy. Avoid buying items you wouldn't normally buy simply because they are on sale.  Check online and in newspapers for weekly deals.  Buy healthy items from the bulk bins when available, such as herbs, spices, flour, pasta, nuts, and dried fruit.  Buy fruits and vegetables that are in season. Prices are usually lower on in-season produce.  Look at the unit price on the price tag. Use it to compare different brands and sizes to find out which item is the best deal.  Choose healthy items that are often low-cost, such as carrots, potatoes, apples, bananas, and oranges. Dried or canned beans are a low-cost protein source.  Buy in bulk and freeze extra food. Items you can buy in bulk include meats, fish, poultry, frozen fruits, and frozen vegetables.  Avoid buying "ready-to-eat" foods, such as pre-cut fruits and vegetables and pre-made salads.  If possible, shop around to discover where you can find the best prices. Consider other retailers such as dollar stores, larger wholesale stores, local fruit and vegetable stands, and farmers markets.  Do not shop when you are hungry. If you shop while hungry, it may be hard to stick to your list and budget.  Resist impulse buying. Use your grocery list as   your official plan for the week.  Buy a variety of vegetables and fruits by purchasing fresh, frozen, and canned items.  Look at the top and bottom shelves for deals. Foods at eye level (eye level of an adult or child) are usually more expensive.  Be efficient with your time when shopping. The more time you spend at the store, the more money you are likely to spend.  To save money when choosing more expensive foods like meats and dairy: ? Choose cheaper cuts of meat, such as bone-in chicken thighs and drumsticks instead of skinless and boneless chicken. When you are ready to prepare the chicken, you can remove the skin yourself to make it healthier. ? Choose lean meats like  chicken or turkey instead of beef. ? Choose canned seafood, such as tuna, salmon, or sardines. ? Buy eggs as a low-cost source of protein. ? Buy dried beans and peas, such as lentils, split peas, or kidney beans instead of meats. Dried beans and peas are a good alternative source of protein. ? Buy the larger tubs of yogurt instead of individual-sized containers.  Choose water instead of sodas and other sweetened beverages.  Avoid buying chips, cookies, and other "junk food." These items are usually expensive and not healthy. Cooking  Make extra food and freeze the extras in meal-sized containers or in individual portions for fast meals and snacks.  Pre-cook on days when you have extra time to prepare meals in advance. You can keep these meals in the fridge or freezer and reheat for a quick meal.  When you come home from the grocery store, wash, peel, and cut fruits and vegetables so they are ready to use and eat. This will help reduce food waste. Meal planning  Do not eat out or get fast food. Prepare food at home.  Make a grocery list and make sure to bring it with you to the store. If you have a smart phone, you could use your phone to create your shopping list.  Plan meals and snacks according to a grocery list and budget you create.  Use leftovers in your meal plan for the week.  Look for recipes where you can cook once and make enough food for two meals.  Include budget-friendly meals like stews, casseroles, and stir-fry dishes.  Try some meatless meals or try "no cook" meals like salads.  Make sure that half your plate is filled with fruits or vegetables. Choose from fresh, frozen, or canned fruits and vegetables. If eating canned, remember to rinse them before eating. This will remove any excess salt added for packaging. Summary  Eating healthy on a budget is possible if you plan your meals according to your budget, purchase according to your budget and grocery list, and  prepare food yourself.  Tips for buying more food on a limited budget include buying generic brands, using coupons only for foods you normally buy, and buying healthy items from the bulk bins when available.  Tips for buying cheaper food to replace expensive food include choosing cheaper, lean cuts of meat, and buying dried beans and peas. This information is not intended to replace advice given to you by your health care provider. Make sure you discuss any questions you have with your health care provider. Document Revised: 12/16/2017 Document Reviewed: 12/16/2017 Elsevier Patient Education  2020 [employer] Inc.   Bone Health Bones protect organs, store calcium, anchor muscles, and support the whole body. Keeping your bones strong is important, especially as you   get older. You can take actions to help keep your bones strong and healthy. Why is keeping my bones healthy important?  Keeping your bones healthy is important because your body constantly replaces bone cells. Cells get old, and new cells take their place. As we age, we lose bone cells because the body may not be able to make enough new cells to replace the old cells. The amount of bone cells and bone tissue you have is referred to as bone mass. The higher your bone mass, the stronger your bones. The aging process leads to an overall loss of bone mass in the body, which can increase the likelihood of:  Joint pain and stiffness.  Broken bones.  A condition in which the bones become weak and brittle (osteoporosis). A large decline in bone mass occurs in older adults. In women, it occurs about the time of menopause. What actions can I take to keep my bones healthy? Good health habits are important for maintaining healthy bones. This includes eating nutritious foods and exercising regularly. To have healthy bones, you need to get enough of the right minerals and vitamins. Most nutrition experts recommend getting these nutrients from the  foods that you eat. In some cases, taking supplements may also be recommended. Doing certain types of exercise is also important for bone health. What are the nutritional recommendations for healthy bones?  Eating a well-balanced diet with plenty of calcium and vitamin D will help to protect your bones. Nutritional recommendations vary from person to person. Ask your health care provider what is healthy for you. Here are some general guidelines. Get enough calcium Calcium is the most important (essential) mineral for bone health. Most people can get enough calcium from their diet, but supplements may be recommended for people who are at risk for osteoporosis. Good sources of calcium include:  Dairy products, such as low-fat or nonfat milk, cheese, and yogurt.  Dark green leafy vegetables, such as bok choy and broccoli.  Calcium-fortified foods, such as orange juice, cereal, bread, soy beverages, and tofu products.  Nuts, such as almonds. Follow these recommended amounts for daily calcium intake:  Children, age 1-3: 700 mg.  Children, age 4-8: 1,000 mg.  Children, age 9-13: 1,300 mg.  Teens, age 14-18: 1,300 mg.  Adults, age 19-50: 1,000 mg.  Adults, age 51-70: ? Men: 1,000 mg. ? Women: 1,200 mg.  Adults, age 71 or older: 1,200 mg.  Pregnant and breastfeeding females: ? Teens: 1,300 mg. ? Adults: 1,000 mg. Get enough vitamin D Vitamin D is the most essential vitamin for bone health. It helps the body absorb calcium. Sunlight stimulates the skin to make vitamin D, so be sure to get enough sunlight. If you live in a cold climate or you do not get outside often, your health care provider may recommend that you take vitamin D supplements. Good sources of vitamin D in your diet include:  Egg yolks.  Saltwater fish.  Milk and cereal fortified with vitamin D. Follow these recommended amounts for daily vitamin D intake:  Children and teens, age 1-18: 600 international  units.  Adults, age 50 or younger: 400-800 international units.  Adults, age 51 or older: 800-1,000 international units. Get other important nutrients Other nutrients that are important for bone health include:  Phosphorus. This mineral is found in meat, poultry, dairy foods, nuts, and legumes. The recommended daily intake for adult men and adult women is 700 mg.  Magnesium. This mineral is found in seeds, nuts, dark   green vegetables, and legumes. The recommended daily intake for adult men is 400-420 mg. For adult women, it is 310-320 mg.  Vitamin K. This vitamin is found in green leafy vegetables. The recommended daily intake is 120 mg for adult men and 90 mg for adult women. What type of physical activity is best for building and maintaining healthy bones? Weight-bearing and strength-building activities are important for building and maintaining healthy bones. Weight-bearing activities cause muscles and bones to work against gravity. Strength-building activities increase the strength of the muscles that support bones. Weight-bearing and muscle-building activities include:  Walking and hiking.  Jogging and running.  Dancing.  Gym exercises.  Lifting weights.  Tennis and racquetball.  Climbing stairs.  Aerobics. Adults should get at least 30 minutes of moderate physical activity on most days. Children should get at least 60 minutes of moderate physical activity on most days. Ask your health care provider what type of exercise is best for you. How can I find out if my bone mass is low? Bone mass can be measured with an X-ray test called a bone mineral density (BMD) test. This test is recommended for all women who are age 65 or older. It may also be recommended for:  Men who are age 70 or older.  People who are at risk for osteoporosis because of: ? Having bones that break easily. ? Having a long-term disease that weakens bones, such as kidney disease or rheumatoid  arthritis. ? Having menopause earlier than normal. ? Taking medicine that weakens bones, such as steroids, thyroid hormones, or hormone treatment for breast cancer or prostate cancer. ? Smoking. ? Drinking three or more alcoholic drinks a day. If you find that you have a low bone mass, you may be able to prevent osteoporosis or further bone loss by changing your diet and lifestyle. Where can I find more information? For more information, check out the following websites:  National Osteoporosis Foundation: www.nof.org/patients  National Institutes of Health: www.bones.nih.gov  International Osteoporosis Foundation: www.iofbonehealth.org Summary  The aging process leads to an overall loss of bone mass in the body, which can increase the likelihood of broken bones and osteoporosis.  Eating a well-balanced diet with plenty of calcium and vitamin D will help to protect your bones.  Weight-bearing and strength-building activities are also important for building and maintaining strong bones.  Bone mass can be measured with an X-ray test called a bone mineral density (BMD) test. This information is not intended to replace advice given to you by your health care provider. Make sure you discuss any questions you have with your health care provider. Document Revised: 01/11/2018 Document Reviewed: 01/11/2018 Elsevier Patient Education  2020 Elsevier Inc.   

## 2020-11-16 ENCOUNTER — Other Ambulatory Visit: Payer: Self-pay

## 2020-11-16 ENCOUNTER — Ambulatory Visit: Payer: Medicaid Other

## 2020-11-16 DIAGNOSIS — R748 Abnormal levels of other serum enzymes: Secondary | ICD-10-CM

## 2020-11-21 ENCOUNTER — Telehealth: Payer: Self-pay | Admitting: Hospice and Palliative Medicine

## 2020-11-21 ENCOUNTER — Other Ambulatory Visit: Payer: Self-pay | Admitting: Hospice and Palliative Medicine

## 2020-11-21 DIAGNOSIS — R160 Hepatomegaly, not elsewhere classified: Secondary | ICD-10-CM

## 2020-11-21 MED ORDER — MELOXICAM 15 MG PO TABS: 15.0000 mg | ORAL_TABLET | Freq: Every day | ORAL | 0 refills | Status: DC

## 2020-11-21 NOTE — Progress Notes (Signed)
Called and spoke to patient about her liver US--mildly enlarged liver with elevated liver enzymes. Will proceed with hepatitis panel.  While on phone patient mentioned continued knee pain discussed at previous visit. Will send Meloxicam 15 mg, once daily.

## 2020-11-21 NOTE — Telephone Encounter (Signed)
Conference call between [place] and [facility] social worker, Doctor, general practice. Further discussions about elevated liver enzymes as well as mildly enlarged liver--proceeding with Hepatitis screening.

## 2020-11-28 ENCOUNTER — Telehealth: Payer: Self-pay

## 2020-11-28 NOTE — Telephone Encounter (Signed)
Pt called that she is having pain in her left leg I advised her as per taylor we can send her for knee xray she said she coming for U/s on 12/3 she going to wait and also advised that taylor prescribed her meloxicam she can take that for pain

## 2020-11-29 ENCOUNTER — Other Ambulatory Visit: Payer: Self-pay

## 2020-11-29 DIAGNOSIS — M7122 Synovial cyst of popliteal space [Baker], left knee: Secondary | ICD-10-CM

## 2020-11-29 LAB — HEPATITIS PANEL, ACUTE
Hep A IgM: NEGATIVE
Hep B C IgM: NEGATIVE
Hep C Virus Ab: <0.1 s/co ratio (ref 0.0–0.9)
Hepatitis B Surface Ag: NEGATIVE

## 2020-11-30 ENCOUNTER — Ambulatory Visit: Payer: Medicaid Other

## 2020-11-30 ENCOUNTER — Other Ambulatory Visit: Payer: Self-pay

## 2020-11-30 DIAGNOSIS — M7122 Synovial cyst of popliteal space [Baker], left knee: Secondary | ICD-10-CM | POA: Diagnosis not present

## 2020-12-12 ENCOUNTER — Other Ambulatory Visit: Payer: Self-pay

## 2020-12-12 ENCOUNTER — Ambulatory Visit
Admission: RE | Admit: 2020-12-12 | Discharge: 2020-12-12 | Disposition: A | Discharge status: Home / Self Care | Payer: Medicaid Other | Source: Ambulatory Visit | Attending: Hospice and Palliative Medicine | Admitting: Hospice and Palliative Medicine

## 2020-12-12 ENCOUNTER — Ambulatory Visit
Admission: RE | Admit: 2020-12-12 | Discharge: 2020-12-12 | Disposition: A | Discharge status: Home / Self Care | Payer: Medicaid Other | Attending: Hospice and Palliative Medicine | Admitting: Hospice and Palliative Medicine

## 2020-12-12 ENCOUNTER — Encounter: Payer: Self-pay | Admitting: Hospice and Palliative Medicine

## 2020-12-12 ENCOUNTER — Ambulatory Visit (INDEPENDENT_AMBULATORY_CARE_PROVIDER_SITE_OTHER): Payer: Medicaid Other | Admitting: Hospice and Palliative Medicine

## 2020-12-12 VITALS — BP 116/80 | HR 75 | Temp 97.3°F | Resp 16 | Ht 63.0 in | Wt 199.6 lb

## 2020-12-12 DIAGNOSIS — Z0001 Encounter for general adult medical examination with abnormal findings: Secondary | ICD-10-CM

## 2020-12-12 DIAGNOSIS — F17219 Nicotine dependence, cigarettes, with unspecified nicotine-induced disorders: Secondary | ICD-10-CM | POA: Diagnosis not present

## 2020-12-12 DIAGNOSIS — R748 Abnormal levels of other serum enzymes: Secondary | ICD-10-CM

## 2020-12-12 DIAGNOSIS — Z124 Encounter for screening for malignant neoplasm of cervix: Secondary | ICD-10-CM

## 2020-12-12 DIAGNOSIS — M25562 Pain in left knee: Secondary | ICD-10-CM | POA: Insufficient documentation

## 2020-12-12 DIAGNOSIS — R300 Dysuria: Secondary | ICD-10-CM

## 2020-12-12 MED ORDER — NICOTINE 14 MG/24HR TD PT24: 14.0000 mg | MEDICATED_PATCH | Freq: Every day | TRANSDERMAL | 0 refills | Status: DC

## 2020-12-12 NOTE — Progress Notes (Signed)
[facility], [place] [address]  Internal MEDICINE  Office Visit Note  Patient Name: Natasha Hayes  [id number]  [id number]  Date of Service: 12/15/2020  Chief Complaint  Patient presents with  . Annual Exam    Left knee hurts, but this morning she made it worse when she got off the floor, pt has been short of breath and thinks she has asthma x few weeks, cold makes it worse, liver still hurts  . ADHD     HPI Pt is here for routine health maintenance examination Overall, things have been going well Recent labs reviewed--liver enzymes elevated, hepatitis screening negative Continues to complain of left knee pain--US normal, no evidence of Baker cyst, pain with walking and range of motion, pain has now moved to the front of her knee--explains that she can feel a click when she extends her knee Living independently now in her own apartment--close contact with her social worker, feels her stress and anxiety are much more controlled now that she is living alone instead of with her family Stressed about finances Sleeps well each night--feels rested throughout the day She did have a panic attack the other week--she walked to [employer], when she arrived to the store she was hyperventilating and had issues with calming herself down--EMS was called, they cleared her and helped calm her down Followed and managed by psychiatrist has not seen a therapist in several months--followed by [facility]  Wants to discuss options to help her quit smoking  Current Medication: Outpatient Encounter Medications as of 12/12/2020  Medication Sig  . atomoxetine (STRATTERA) 80 MG capsule Take 80 mg by mouth every morning.  . meloxicam (MOBIC) 15 MG tablet Take 1 tablet (15 mg total) by mouth daily.  . nicotine (NICODERM CQ - DOSED IN MG/24 HOURS) 14 mg/24hr patch Place 1 patch (14 mg total) onto the skin daily.   No facility-administered encounter medications on file as of  12/12/2020.    Surgical History: History reviewed. No pertinent surgical history.  Medical History: Past Medical History:  Diagnosis Date  . ADHD   . Insomnia   . Mood disorder ([place])   . Renal disorder    kdiney stones    Family History: Family History  Problem Relation Age of Onset  . Cancer Paternal Grandfather        lung  . Depression Paternal Grandfather       Review of Systems  Constitutional: Negative for chills, diaphoresis and fatigue.  HENT: Negative for ear pain, postnasal drip and sinus pressure.   Eyes: Negative for photophobia, discharge, redness, itching and visual disturbance.  Respiratory: Negative for cough, shortness of breath and wheezing.   Cardiovascular: Negative for chest pain, palpitations and leg swelling.  Gastrointestinal: Negative for abdominal pain, constipation, diarrhea, nausea and vomiting.  Genitourinary: Negative for dysuria and flank pain.  Musculoskeletal: Negative for arthralgias, back pain, gait problem and neck pain.       Left knee pain  Skin: Negative for color change.  Allergic/Immunologic: Negative for environmental allergies and food allergies.  Neurological: Negative for dizziness and headaches.  Hematological: Does not bruise/bleed easily.  Psychiatric/Behavioral: Negative for agitation, behavioral problems (depression) and hallucinations.     Vital Signs: BP 116/80   Pulse 75   Temp (!) 97.3 F (36.3 C)   Resp 16   Ht '5\' 3"'  (1.6 m)   Wt 199 lb 9.6 oz (90.5 kg)   SpO2 98%   BMI 35.36 kg/m  Physical Exam Vitals reviewed.  Constitutional:      Appearance: Normal appearance. She is obese.  HENT:     Right Ear: Tympanic membrane normal.     Left Ear: Tympanic membrane normal.     Nose: Nose normal.     Mouth/Throat:     Mouth: Mucous membranes are moist.  Eyes:     Pupils: Pupils are equal, round, and reactive to light.  Cardiovascular:     Rate and Rhythm: Normal rate and regular rhythm.     Pulses:  Normal pulses.     Heart sounds: Normal heart sounds.  Pulmonary:     Effort: Pulmonary effort is normal.     Breath sounds: Normal breath sounds.  Chest:  Breasts:     Right: Normal.     Left: Normal.    Abdominal:     General: Abdomen is flat.     Palpations: Abdomen is soft.  Musculoskeletal:     Cervical back: Normal range of motion.     Comments: Pain response with left knee ROM, passive and active   Skin:    General: Skin is warm.  Neurological:     General: No focal deficit present.     Mental Status: She is alert and oriented to person, place, and time. Mental status is at baseline.  Psychiatric:        Mood and Affect: Mood normal.        Behavior: Behavior normal.        Thought Content: Thought content normal.        Judgment: Judgment normal.      LABS: Recent Results (from the past 2160 hour(s))  POCT Urinalysis Dipstick     Status: None   Collection Time: 10/10/20  3:26 PM  Result Value Ref Range   Color, UA     Clarity, UA     Glucose, UA Negative Negative   Bilirubin, UA     Ketones, UA     Spec Grav, UA 1.010 1.010 - 1.025   Blood, UA     pH, UA 7.0 5.0 - 8.0   Protein, UA Negative Negative   Urobilinogen, UA 0.2 0.2 or 1.0 E.U./dL   Nitrite, UA     Leukocytes, UA Negative Negative   Appearance     Odor    Urine Culture     Status: None   Collection Time: 10/10/20  3:30 PM   Specimen: Urine   UR  Result Value Ref Range   Urine Culture, Routine Final report    Organism ID, Bacteria Comment     Comment: Mixed urogenital flora Less than 10,000 colonies/mL   POCT Glucose (CBG)     Status: Abnormal   Collection Time: 10/30/20 10:23 AM  Result Value Ref Range   POC Glucose 117 (A) 70 - 99 mg/dl  POCT HgB A1C     Status: None   Collection Time: 10/30/20 10:26 AM  Result Value Ref Range   Hemoglobin A1C 5.5 4.0 - 5.6 %   HbA1c POC (<> result, manual entry)     HbA1c, POC (prediabetic range)     HbA1c, POC (controlled diabetic range)     POCT Urinalysis Dipstick     Status: Abnormal   Collection Time: 10/30/20 10:26 AM  Result Value Ref Range   Color, UA     Clarity, UA     Glucose, UA Negative Negative   Bilirubin, UA negative    Ketones, UA Negative  Spec Grav, UA >=1.030 (A) 1.010 - 1.025   Blood, UA Negative    pH, UA 5.0 5.0 - 8.0   Protein, UA Negative Negative   Urobilinogen, UA 0.2 0.2 or 1.0 E.U./dL   Nitrite, UA Negative    Leukocytes, UA Negative Negative   Appearance     Odor    CBC w/Diff/Platelet     Status: None   Collection Time: 10/30/20 11:44 AM  Result Value Ref Range   WBC 7.5 3.4 - 10.8 x10E3/uL   RBC 4.55 3.77 - 5.28 x10E6/uL   Hemoglobin 14.1 11.1 - 15.9 g/dL   Hematocrit 41.1 34.0 - 46.6 %   MCV 90 79 - 97 fL   MCH 31.0 26.6 - 33.0 pg   MCHC 34.3 31.5 - 35.7 g/dL   RDW 12.6 11.7 - 15.4 %   Platelets 240 150 - 450 x10E3/uL   Neutrophils 52 Not Estab. %   Lymphs 34 Not Estab. %   Monocytes 9 Not Estab. %   Eos 4 Not Estab. %   Basos 1 Not Estab. %   Neutrophils Absolute 4.0 1.4 - 7.0 x10E3/uL   Lymphocytes Absolute 2.5 0.7 - 3.1 x10E3/uL   Monocytes Absolute 0.6 0.1 - 0.9 x10E3/uL   EOS (ABSOLUTE) 0.3 0.0 - 0.4 x10E3/uL   Basophils Absolute 0.0 0.0 - 0.2 x10E3/uL   Immature Granulocytes 0 Not Estab. %   Immature Grans (Abs) 0.0 0.0 - 0.1 x10E3/uL  Comprehensive Metabolic Panel (CMET)     Status: Abnormal   Collection Time: 10/30/20 11:44 AM  Result Value Ref Range   Glucose 98 65 - 99 mg/dL   BUN 10 6 - 20 mg/dL   Creatinine, Ser 0.71 0.57 - 1.00 mg/dL   GFR calc non Af Amer 123 >59 mL/min/1.73   GFR calc Af Amer 142 >59 mL/min/1.73    Comment: **In accordance with recommendations from the NKF-ASN Task force,**   [facility] is in the process of updating its eGFR calculation to the   2021 CKD-EPI creatinine equation that estimates kidney function   without a race variable.    BUN/Creatinine Ratio 14 9 - 23   Sodium 138 134 - 144 mmol/L   Potassium 4.6 3.5 - 5.2 mmol/L    Chloride 104 96 - 106 mmol/L   CO2 22 20 - 29 mmol/L   Calcium 9.3 8.7 - 10.2 mg/dL   Total Protein 6.5 6.0 - 8.5 g/dL   Albumin 4.5 3.9 - 5.0 g/dL   Globulin, Total 2.0 1.5 - 4.5 g/dL   Albumin/Globulin Ratio 2.3 (H) 1.2 - 2.2   Bilirubin Total 0.2 0.0 - 1.2 mg/dL   Alkaline Phosphatase 58 42 - 106 IU/L    Comment:               **Please note reference interval change**   AST 29 0 - 40 IU/L   ALT 64 (H) 0 - 32 IU/L  Lipid Panel With LDL/HDL Ratio     Status: Abnormal   Collection Time: 10/30/20 11:44 AM  Result Value Ref Range   Cholesterol, Total 134 100 - 199 mg/dL   Triglycerides 140 0 - 149 mg/dL   HDL 35 (L) >39 mg/dL   VLDL Cholesterol Cal 25 5 - 40 mg/dL   LDL Chol Calc (NIH) 74 0 - 99 mg/dL   LDL/HDL Ratio 2.1 0.0 - 3.2 ratio    Comment:  LDL/HDL Ratio                                             Men  Women                               1/2 Avg.Risk  1.0    1.5                                   Avg.Risk  3.6    3.2                                2X Avg.Risk  6.2    5.0                                3X Avg.Risk  8.0    6.1   TSH + free T4     Status: None   Collection Time: 10/30/20 11:44 AM  Result Value Ref Range   TSH 0.859 0.450 - 4.500 uIU/mL   Free T4 1.10 0.82 - 1.77 ng/dL  POCT urine pregnancy     Status: None   Collection Time: 11/07/20  4:23 PM  Result Value Ref Range   Preg Test, Ur Negative Negative  Hepatitis, Acute     Status: None   Collection Time: 11/28/20  1:11 PM  Result Value Ref Range   Hep A IgM Negative Negative   Hepatitis B Surface Ag Negative Negative   Hep B C IgM Negative Negative   Hep C Virus Ab <0.1 0.0 - 0.9 s/co ratio    Comment:                                   Negative:     < 0.8                              Indeterminate: 0.8 - 0.9                                   Positive:     > 0.9  The CDC recommends that a positive HCV antibody result  be followed up with a HCV Nucleic Acid  Amplification  test (009381).   UA/M w/rflx Culture, Routine     Status: Abnormal   Collection Time: 12/12/20 10:47 AM   Specimen: Urine   Urine  Result Value Ref Range   Specific Gravity, UA 1.021 1.005 - 1.030   pH, UA 5.5 5.0 - 7.5   Color, UA Yellow Yellow   Appearance Ur Turbid (A) Clear   Leukocytes,UA Negative Negative   Protein,UA Negative Negative/Trace   Glucose, UA Negative Negative   Ketones, UA Negative Negative   RBC, UA Negative Negative   Bilirubin, UA Negative Negative   Urobilinogen, Ur 0.2 0.2 - 1.0 mg/dL   Nitrite, UA Negative Negative   Microscopic Examination Comment     Comment: Microscopic follows if  indicated.   Microscopic Examination See below:     Comment: Microscopic was indicated and was performed.   Urinalysis Reflex Comment     Comment: This specimen has reflexed to a Urine Culture.  Microscopic Examination     Status: Abnormal   Collection Time: 12/12/20 10:47 AM   Urine  Result Value Ref Range   WBC, UA 0-5 0 - 5 /hpf   RBC None seen 0 - 2 /hpf   Epithelial Cells (non renal) >10 (A) 0 - 10 /hpf   Casts None seen None seen /lpf   Bacteria, UA Moderate (A) None seen/Few  Urine Culture, Reflex     Status: None   Collection Time: 12/12/20 10:47 AM   Urine  Result Value Ref Range   Urine Culture, Routine Final report    Organism ID, Bacteria Comment     Comment: Mixed urogenital flora 10,000-25,000 colony forming units per mL   Comprehensive Metabolic Panel (CMET)     Status: Abnormal   Collection Time: 12/12/20 11:33 AM  Result Value Ref Range   Glucose 116 (H) 65 - 99 mg/dL   BUN 9 6 - 20 mg/dL   Creatinine, Ser 0.63 0.57 - 1.00 mg/dL   GFR calc non Af Amer 130 >59 mL/min/1.73   GFR calc Af Amer 149 >59 mL/min/1.73    Comment: **In accordance with recommendations from the NKF-ASN Task force,**   [facility] is in the process of updating its eGFR calculation to the   2021 CKD-EPI creatinine equation that estimates kidney function    without a race variable.    BUN/Creatinine Ratio 14 9 - 23   Sodium 138 134 - 144 mmol/L   Potassium 4.5 3.5 - 5.2 mmol/L   Chloride 104 96 - 106 mmol/L   CO2 18 (L) 20 - 29 mmol/L   Calcium 8.9 8.7 - 10.2 mg/dL   Total Protein 6.1 6.0 - 8.5 g/dL   Albumin 4.5 3.9 - 5.0 g/dL   Globulin, Total 1.6 1.5 - 4.5 g/dL   Albumin/Globulin Ratio 2.8 (H) 1.2 - 2.2   Bilirubin Total 0.3 0.0 - 1.2 mg/dL   Alkaline Phosphatase 56 42 - 106 IU/L    Comment:               **Please note reference interval change**   AST 21 0 - 40 IU/L   ALT 30 0 - 32 IU/L    Assessment/Plan: 1. Encounter for routine adult health examination with abnormal findings Well appearing 19 year old female Labs reviewed Up to date on PHM  2. Elevated liver enzymes Repeat labs due to elevated liver enzymes, follow-up hepatitis screening negative Will follow-up with liver US if levels remain elevated Denies excessive ETOH intake - Comprehensive Metabolic Panel (CMET)  3. Acute pain of left knee [place] negative--will obtain x-ray and referral to ortho for further evaluation and management Advised to wear knee brace to help with pain - DG Knee Complete 4 Views Left; Future - Ambulatory referral to Orthopedic Surgery - DG Knee Complete 4 Views Left  4. Cigarette nicotine dependence with nicotine-induced disorder Discussed importance of smoking cessation--she is ready to work on this Advised not to wear patch and smoke cigarette together Will need to work on cutting back the amount she smokes each day - nicotine (NICODERM CQ - DOSED IN MG/24 HOURS) 14 mg/24hr patch; Place 1 patch (14 mg total) onto the skin daily.  Dispense: 28 patch; Refill: 0  5. Dysuria - UA/M w/rflx  Culture, Routine - Microscopic Examination - Urine Culture, Reflex  General Counseling: Kourtlyn verbalizes understanding of the findings of todays visit and agrees with plan of treatment. I have discussed any further diagnostic evaluation that may be needed  or ordered today. We also reviewed her medications today. she has been encouraged to call the office with any questions or concerns that should arise related to todays visit.    Counseling: It is very important that she quit smoking. There are various alternatives available to help with this difficult task, but first and foremost, she must make a firm commitment and decision to quit. The nature of nicotine addiction is discussed. The usefulness of behavioral therapy is discussed and suggested.  The correct use, cost and side effects of nicotine replacement therapy such as gum or patches is discussed. Bupropion and its cost (sometimes not covered fully by insurance) and side effects are reviewed. The quit rates are discussed. I recommend she not allow potential costs of treatment to deter her from using nicotine replacement therapy or bupropion, as the long term economic and health benefits are obvious.   Orders Placed This Encounter  Procedures  . Microscopic Examination  . Urine Culture, Reflex  . DG Knee Complete 4 Views Left  . UA/M w/rflx Culture, Routine  . Comprehensive Metabolic Panel (CMET)  . Ambulatory referral to Orthopedic Surgery    Meds ordered this encounter  Medications  . nicotine (NICODERM CQ - DOSED IN MG/24 HOURS) 14 mg/24hr patch    Sig: Place 1 patch (14 mg total) onto the skin daily.    Dispense:  28 patch    Refill:  0    Total time spent: 30 Minutes  Time spent includes review of chart, medications, test results, and follow up plan with the patient.   This patient was seen by Casey Burkitt AGNP-C Collaboration with Dr Lavera Guise as a part of collaborative care agreement   Tanna Furry. [facility] Internal Medicine

## 2020-12-13 LAB — COMPREHENSIVE METABOLIC PANEL
ALT: 30 IU/L (ref 0–32)
AST: 21 IU/L (ref 0–40)
Albumin/Globulin Ratio: 2.8 — ABNORMAL HIGH (ref 1.2–2.2)
Albumin: 4.5 g/dL (ref 3.9–5.0)
Alkaline Phosphatase: 56 IU/L (ref 42–106)
BUN/Creatinine Ratio: 14 (ref 9–23)
BUN: 9 mg/dL (ref 6–20)
Bilirubin Total: 0.3 mg/dL (ref 0.0–1.2)
CO2: 18 mmol/L — ABNORMAL LOW (ref 20–29)
Calcium: 8.9 mg/dL (ref 8.7–10.2)
Chloride: 104 mmol/L (ref 96–106)
Creatinine, Ser: 0.63 mg/dL (ref 0.57–1.00)
GFR calc Af Amer: 149 mL/min/{1.73_m2} (ref >59)
GFR calc non Af Amer: 130 mL/min/{1.73_m2} (ref >59)
Globulin, Total: 1.6 g/dL (ref 1.5–4.5)
Glucose: 116 mg/dL — ABNORMAL HIGH (ref 65–99)
Potassium: 4.5 mmol/L (ref 3.5–5.2)
Sodium: 138 mmol/L (ref 134–144)
Total Protein: 6.1 g/dL (ref 6.0–8.5)

## 2020-12-14 ENCOUNTER — Ambulatory Visit: Payer: Medicaid Other | Admitting: Family Medicine

## 2020-12-15 ENCOUNTER — Encounter: Payer: Self-pay | Admitting: Hospice and Palliative Medicine

## 2020-12-15 LAB — UA/M W/RFLX CULTURE, ROUTINE
Bilirubin, UA: NEGATIVE
Glucose, UA: NEGATIVE
Ketones, UA: NEGATIVE
Leukocytes,UA: NEGATIVE
Nitrite, UA: NEGATIVE
Protein,UA: NEGATIVE
RBC, UA: NEGATIVE
Specific Gravity, UA: 1.021 (ref 1.005–1.030)
Urobilinogen, Ur: 0.2 mg/dL (ref 0.2–1.0)
pH, UA: 5.5 (ref 5.0–7.5)

## 2020-12-15 LAB — URINE CULTURE, REFLEX

## 2020-12-15 LAB — MICROSCOPIC EXAMINATION
Casts: NONE SEEN /lpf
Epithelial Cells (non renal): >10 /hpf — AB (ref 0–10)
RBC, Urine: NONE SEEN /hpf (ref 0–2)

## 2020-12-27 ENCOUNTER — Other Ambulatory Visit: Payer: Self-pay

## 2020-12-27 ENCOUNTER — Encounter: Payer: Self-pay | Admitting: Hospice and Palliative Medicine

## 2020-12-27 ENCOUNTER — Ambulatory Visit (INDEPENDENT_AMBULATORY_CARE_PROVIDER_SITE_OTHER): Payer: Medicaid Other | Admitting: Hospice and Palliative Medicine

## 2020-12-27 VITALS — BP 114/72 | HR 77 | Temp 97.7°F | Resp 16 | Ht 63.0 in | Wt 194.8 lb

## 2020-12-27 DIAGNOSIS — F411 Generalized anxiety disorder: Secondary | ICD-10-CM

## 2020-12-27 DIAGNOSIS — R1011 Right upper quadrant pain: Secondary | ICD-10-CM | POA: Diagnosis not present

## 2021-01-01 ENCOUNTER — Telehealth: Payer: Self-pay

## 2021-01-01 ENCOUNTER — Encounter: Payer: Self-pay | Admitting: Hospice and Palliative Medicine

## 2021-01-01 NOTE — Progress Notes (Signed)
[facility] [address] [place], [place] [address]  Internal MEDICINE  Office Visit Note  Patient Name: Natasha Hayes  [id number]  [id number]  Date of Service: 01/01/2021  Chief Complaint  Patient presents with  . Acute Visit    Knot under right breast, walking for long distances hurt that area, difficult to breath, felt like stabbing pain, area is tender     HPI Pt is here for a sick visit. Complaining today of "knot" upper right quadrant of abdomen, directly below right breast but not connected to breast Her boyfriend noticed the "knot" a few days ago when she was getting out of the shower, she does associate pain in the area, when she is walking she feels the pain  Fairly distraught as she is afraid this is cancerous and would like this to be removed immediately, she is going on vacation next week and asks me to remove the knot today  Social worker present during exam  Current Medication:  Outpatient Encounter Medications as of 12/27/2020  Medication Sig  . atomoxetine (STRATTERA) 80 MG capsule Take 80 mg by mouth every morning.  . nicotine (NICODERM CQ - DOSED IN MG/24 HOURS) 14 mg/24hr patch Place 1 patch (14 mg total) onto the skin daily.  . [DISCONTINUED] meloxicam (MOBIC) 15 MG tablet Take 1 tablet (15 mg total) by mouth daily. (Patient not taking: Reported on 12/27/2020)   No facility-administered encounter medications on file as of 12/27/2020.      Medical History: Past Medical History:  Diagnosis Date  . ADHD   . Insomnia   . Mood disorder ([facility])   . Renal disorder    kdiney stones     Vital Signs: BP 114/72   Pulse 77   Temp 97.7 F (36.5 C)   Resp 16   Ht 5\' 3"  (1.6 m)   Wt 194 lb 12.8 oz (88.4 kg)   SpO2 99%   BMI 34.51 kg/m    Review of Systems  Constitutional: Negative for chills, diaphoresis and fatigue.  HENT: Negative for ear pain, postnasal drip and sinus pressure.   Eyes: Negative for photophobia, discharge, redness,  itching and visual disturbance.  Respiratory: Negative for cough, shortness of breath and wheezing.   Cardiovascular: Negative for chest pain, palpitations and leg swelling.  Gastrointestinal: Negative for abdominal pain, constipation, diarrhea, nausea and vomiting.       Right upper abdominal "knot" with pain and tenderness  Genitourinary: Negative for dysuria and flank pain.  Musculoskeletal: Negative for arthralgias, back pain, gait problem and neck pain.  Skin: Negative for color change.  Allergic/Immunologic: Negative for environmental allergies and food allergies.  Neurological: Negative for dizziness and headaches.  Hematological: Does not bruise/bleed easily.  Psychiatric/Behavioral: Negative for agitation, behavioral problems (depression) and hallucinations.    Physical Exam Vitals reviewed.  Constitutional:      Appearance: Normal appearance. She is obese.  Cardiovascular:     Rate and Rhythm: Normal rate and regular rhythm.     Pulses: Normal pulses.     Heart sounds: Normal heart sounds.  Pulmonary:     Effort: Pulmonary effort is normal.     Breath sounds: Normal breath sounds.  Abdominal:     Tenderness: There is abdominal tenderness in the right upper quadrant.     Comments: Minimal distention to right upper quadrant, directly below right breast, no palpable knot or cyst  Neurological:     General: No focal deficit present.     Mental Status:  She is alert and oriented to person, place, and time. Mental status is at baseline.  Psychiatric:        Attention and Perception: Attention normal.        Mood and Affect: Mood is anxious.        Speech: Speech normal.        Behavior: Behavior is hyperactive. Behavior is cooperative.        Thought Content: Thought content is paranoid.        Cognition and Memory: Cognition normal.        Judgment: Judgment is impulsive.    Assessment/Plan: 1. Right upper quadrant abdominal pain Attempted to reassure her that abdominal  US was completed last month that revealed minimally enlarged liver--attempted to reassure there was no obvious "knot" or cyst that can be removed She insisted this be removed today--advised she can be seen by urgent care for futrher evaluation If she does not follow through with urgent care visit advised to contact office and further work-up can be done but not on emergent basis as this does not warrant emergent evaluation  2. GAD (generalized anxiety disorder) Attempted to reassure her that [place] last month did not show results of evidence of cancer or abnormal nodules or lesions Attempted to have her use deep breathing techniques and logically think and process through her fears Left with social worker and advised social worker to call if needed  General Counseling: Skyleen verbalizes understanding of the findings of todays visit and agrees with plan of treatment. I have discussed any further diagnostic evaluation that may be needed or ordered today. We also reviewed her medications today. she has been encouraged to call the office with any questions or concerns that should arise related to todays visit.  Time spent: 30 Minutes Time spent includes review of chart, medications, test results and follow-up plan with the patient.  This patient was seen by Leeanne Deed AGNP-C in Collaboration with Dr Lyndon Code as a part of collaborative care agreement.  [facility] Internal Medicine

## 2021-01-02 NOTE — Telephone Encounter (Signed)
Provider spoke with patient caregiver and scheduled an appointment. Natasha Hayes

## 2021-01-07 ENCOUNTER — Ambulatory Visit: Payer: Self-pay

## 2021-01-08 ENCOUNTER — Ambulatory Visit: Payer: Medicaid Other | Admitting: Hospice and Palliative Medicine

## 2021-02-08 ENCOUNTER — Ambulatory Visit (INDEPENDENT_AMBULATORY_CARE_PROVIDER_SITE_OTHER): Payer: Medicaid Other | Admitting: Hospice and Palliative Medicine

## 2021-02-08 ENCOUNTER — Other Ambulatory Visit: Payer: Self-pay

## 2021-02-08 ENCOUNTER — Encounter: Payer: Self-pay | Admitting: Hospice and Palliative Medicine

## 2021-02-08 VITALS — BP 135/79 | HR 79 | Temp 97.8°F | Resp 16 | Ht 63.0 in | Wt 200.0 lb

## 2021-02-08 DIAGNOSIS — J0140 Acute pansinusitis, unspecified: Secondary | ICD-10-CM | POA: Diagnosis not present

## 2021-02-08 DIAGNOSIS — F17219 Nicotine dependence, cigarettes, with unspecified nicotine-induced disorders: Secondary | ICD-10-CM

## 2021-02-08 DIAGNOSIS — R1011 Right upper quadrant pain: Secondary | ICD-10-CM

## 2021-02-08 DIAGNOSIS — R140 Abdominal distension (gaseous): Secondary | ICD-10-CM | POA: Diagnosis not present

## 2021-02-08 MED ORDER — AZITHROMYCIN 250 MG PO TABS: ORAL_TABLET | ORAL | 0 refills | Status: DC
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
## 2021-02-12 ENCOUNTER — Encounter: Payer: Self-pay | Admitting: Emergency Medicine

## 2021-02-12 ENCOUNTER — Emergency Department: Payer: Medicaid Other

## 2021-02-12 ENCOUNTER — Telehealth: Payer: Self-pay

## 2021-02-12 ENCOUNTER — Emergency Department
Admission: EM | Admit: 2021-02-12 | Discharge: 2021-02-12 | Disposition: A | Discharge status: Home / Self Care | Payer: Medicaid Other | Attending: Emergency Medicine | Admitting: Emergency Medicine

## 2021-02-12 ENCOUNTER — Other Ambulatory Visit: Payer: Self-pay

## 2021-02-12 DIAGNOSIS — Z720 Tobacco use: Secondary | ICD-10-CM

## 2021-02-12 DIAGNOSIS — J40 Bronchitis, not specified as acute or chronic: Secondary | ICD-10-CM | POA: Diagnosis not present

## 2021-02-12 DIAGNOSIS — R1011 Right upper quadrant pain: Secondary | ICD-10-CM | POA: Diagnosis present

## 2021-02-12 DIAGNOSIS — F17210 Nicotine dependence, cigarettes, uncomplicated: Secondary | ICD-10-CM | POA: Insufficient documentation

## 2021-02-12 DIAGNOSIS — Z20822 Contact with and (suspected) exposure to covid-19: Secondary | ICD-10-CM | POA: Insufficient documentation

## 2021-02-12 DIAGNOSIS — I10 Essential (primary) hypertension: Secondary | ICD-10-CM

## 2021-02-12 DIAGNOSIS — K760 Fatty (change of) liver, not elsewhere classified: Secondary | ICD-10-CM | POA: Insufficient documentation

## 2021-02-12 LAB — URINALYSIS, COMPLETE (UACMP) WITH MICROSCOPIC
Bilirubin Urine: NEGATIVE
Glucose, UA: NEGATIVE mg/dL
Hgb urine dipstick: NEGATIVE
Ketones, ur: NEGATIVE mg/dL
Leukocytes,Ua: NEGATIVE
Nitrite: NEGATIVE
Protein, ur: 30 mg/dL — AB
Specific Gravity, Urine: 1.018 (ref 1.005–1.030)
pH: 7 (ref 5.0–8.0)

## 2021-02-12 LAB — CBC
HCT: 44.3 % (ref 36.0–46.0)
Hemoglobin: 15.2 g/dL — ABNORMAL HIGH (ref 12.0–15.0)
MCH: 30.2 pg (ref 26.0–34.0)
MCHC: 34.3 g/dL (ref 30.0–36.0)
MCV: 88.1 fL (ref 80.0–100.0)
Platelets: 256 10*3/uL (ref 150–400)
RBC: 5.03 MIL/uL (ref 3.87–5.11)
RDW: 12 % (ref 11.5–15.5)
WBC: 8.2 10*3/uL (ref 4.0–10.5)
nRBC: 0 % (ref 0.0–0.2)

## 2021-02-12 LAB — COMPREHENSIVE METABOLIC PANEL
ALT: 57 U/L — ABNORMAL HIGH (ref 0–44)
AST: 38 U/L (ref 15–41)
Albumin: 4.7 g/dL (ref 3.5–5.0)
Alkaline Phosphatase: 56 U/L (ref 38–126)
Anion gap: 10 (ref 5–15)
BUN: 10 mg/dL (ref 6–20)
CO2: 20 mmol/L — ABNORMAL LOW (ref 22–32)
Calcium: 9.3 mg/dL (ref 8.9–10.3)
Chloride: 106 mmol/L (ref 98–111)
Creatinine, Ser: 0.62 mg/dL (ref 0.44–1.00)
GFR, Estimated: >60 mL/min (ref >60)
Glucose, Bld: 122 mg/dL — ABNORMAL HIGH (ref 70–99)
Potassium: 3.8 mmol/L (ref 3.5–5.1)
Sodium: 136 mmol/L (ref 135–145)
Total Bilirubin: 0.6 mg/dL (ref 0.3–1.2)
Total Protein: 7.6 g/dL (ref 6.5–8.1)

## 2021-02-12 LAB — RESP PANEL BY RT-PCR (FLU A&B, COVID) ARPGX2
Influenza A by PCR: NEGATIVE
Influenza B by PCR: NEGATIVE
SARS Coronavirus 2 by RT PCR: NEGATIVE

## 2021-02-12 LAB — POC URINE PREG, ED: Preg Test, Ur: NEGATIVE

## 2021-02-12 LAB — LIPASE, BLOOD: Lipase: 30 U/L (ref 11–51)

## 2021-02-12 MED ORDER — KETOROLAC TROMETHAMINE 30 MG/ML IJ SOLN
30.0000 mg | Freq: Once | INTRAMUSCULAR | Status: AC
  Administered 2021-02-12: 30 mg via INTRAVENOUS
  Filled 2021-02-12: qty 1

## 2021-02-12 MED ORDER — IOHEXOL 300 MG/ML  SOLN
100.0000 mL | Freq: Once | INTRAMUSCULAR | Status: DC | PRN
  Filled 2021-02-12: qty 100

## 2021-02-12 MED ORDER — IOHEXOL 350 MG/ML SOLN
75.0000 mL | Freq: Once | INTRAVENOUS | Status: AC | PRN
  Administered 2021-02-12: 75 mL via INTRAVENOUS
  Filled 2021-02-12: qty 75
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
## 2021-02-12 NOTE — ED Triage Notes (Addendum)
Patient to ER for c/o RUQ abd pain x1 +month. Patient states she has had ultrasound with PCP and has been scheduled for follow ups (unknown who, but believes it may be a Careers adviser and/or GI doctor in [place]). Patient states she has taken Tylenol and IBU with no relief. Vitals per EMS WNL.

## 2021-02-12 NOTE — Telephone Encounter (Signed)
Pt called that she having terrible pain right side under her breast pt advised by taylor to go ED

## 2021-02-13 ENCOUNTER — Encounter: Payer: Self-pay | Admitting: Hospice and Palliative Medicine

## 2021-02-23 ENCOUNTER — Emergency Department
Admission: EM | Admit: 2021-02-23 | Discharge: 2021-02-24 | Disposition: A | Discharge status: Home / Self Care | Payer: Medicaid Other | Attending: Emergency Medicine | Admitting: Emergency Medicine

## 2021-02-23 ENCOUNTER — Other Ambulatory Visit: Payer: Self-pay

## 2021-02-23 DIAGNOSIS — J029 Acute pharyngitis, unspecified: Secondary | ICD-10-CM | POA: Insufficient documentation

## 2021-02-23 DIAGNOSIS — R21 Rash and other nonspecific skin eruption: Secondary | ICD-10-CM | POA: Insufficient documentation

## 2021-02-23 DIAGNOSIS — F17210 Nicotine dependence, cigarettes, uncomplicated: Secondary | ICD-10-CM | POA: Insufficient documentation

## 2021-02-23 LAB — CBC WITH DIFFERENTIAL/PLATELET
Abs Immature Granulocytes: 0.01 10*3/uL (ref 0.00–0.07)
Basophils Absolute: 0 10*3/uL (ref 0.0–0.1)
Basophils Relative: 0 %
Eosinophils Absolute: 0.1 10*3/uL (ref 0.0–0.5)
Eosinophils Relative: 2 %
HCT: 42.4 % (ref 36.0–46.0)
Hemoglobin: 14.2 g/dL (ref 12.0–15.0)
Immature Granulocytes: 0 %
Lymphocytes Relative: 33 %
Lymphs Abs: 2.1 10*3/uL (ref 0.7–4.0)
MCH: 29.8 pg (ref 26.0–34.0)
MCHC: 33.5 g/dL (ref 30.0–36.0)
MCV: 89.1 fL (ref 80.0–100.0)
Monocytes Absolute: 0.8 10*3/uL (ref 0.1–1.0)
Monocytes Relative: 12 %
Neutro Abs: 3.4 10*3/uL (ref 1.7–7.7)
Neutrophils Relative %: 53 %
Platelets: 217 10*3/uL (ref 150–400)
RBC: 4.76 MIL/uL (ref 3.87–5.11)
RDW: 12.2 % (ref 11.5–15.5)
WBC: 6.3 10*3/uL (ref 4.0–10.5)
nRBC: 0 % (ref 0.0–0.2)

## 2021-02-23 LAB — BASIC METABOLIC PANEL
Anion gap: 11 (ref 5–15)
BUN: 6 mg/dL (ref 6–20)
CO2: 21 mmol/L — ABNORMAL LOW (ref 22–32)
Calcium: 9.1 mg/dL (ref 8.9–10.3)
Chloride: 104 mmol/L (ref 98–111)
Creatinine, Ser: 0.64 mg/dL (ref 0.44–1.00)
GFR, Estimated: >60 mL/min (ref >60)
Glucose, Bld: 109 mg/dL — ABNORMAL HIGH (ref 70–99)
Potassium: 3.4 mmol/L — ABNORMAL LOW (ref 3.5–5.1)
Sodium: 136 mmol/L (ref 135–145)

## 2021-02-23 LAB — GROUP A STREP BY PCR: Group A Strep by PCR: NOT DETECTED

## 2021-02-23 MED ORDER — EPINEPHRINE 0.3 MG/0.3ML IJ SOAJ
INTRAMUSCULAR | Status: AC
  Filled 2021-02-23: qty 0.3

## 2021-02-23 MED ORDER — DIPHENHYDRAMINE HCL 25 MG PO CAPS
50.0000 mg | ORAL_CAPSULE | Freq: Once | ORAL | Status: AC
  Administered 2021-02-23: 50 mg via ORAL
  Filled 2021-02-23: qty 2

## 2021-02-23 MED ORDER — PREDNISONE 20 MG PO TABS: 20.0000 mg | ORAL_TABLET | Freq: Every day | ORAL | 0 refills | Status: DC

## 2021-02-23 MED ORDER — EPINEPHRINE 0.3 MG/0.3ML IJ SOAJ: 0.3000 mg | INTRAMUSCULAR | 0 refills | Status: DC | PRN

## 2021-02-23 MED ORDER — METHYLPREDNISOLONE SODIUM SUCC 125 MG IJ SOLR
125.0000 mg | Freq: Once | INTRAMUSCULAR | Status: AC
  Administered 2021-02-23: 125 mg via INTRAVENOUS
  Filled 2021-02-23: qty 2
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
## 2021-02-23 NOTE — ED Notes (Addendum)
Pt presents c/o of allergic reaction. Pt states she feels her throat "closing up." Denies shortness of breath of taking any medicine.  Pt does report painful to swallow.

## 2021-02-23 NOTE — ED Triage Notes (Signed)
Pt states she noticed rash to inside of mouth and throat earlier tonight, pt states she is having some difficulty swallowing and breathing, pt denies being exposed to anything new. Pt denies taking any meds pta. Pt able to speak in complete sentences during triage

## 2021-02-23 NOTE — ED Notes (Signed)
Pt on call bell to alert this RN of developing hives on her arms. This RN observed red bumps on her both forearms. ED provider made aware.

## 2021-02-23 NOTE — Discharge Instructions (Signed)
Please seek medical attention for any high fevers, chest pain, shortness of breath, change in behavior, persistent vomiting, bloody stool or any other new or concerning symptoms.  

## 2021-03-12 ENCOUNTER — Ambulatory Visit (INDEPENDENT_AMBULATORY_CARE_PROVIDER_SITE_OTHER): Payer: Medicaid Other | Admitting: Hospice and Palliative Medicine

## 2021-03-12 ENCOUNTER — Encounter: Payer: Self-pay | Admitting: Hospice and Palliative Medicine

## 2021-03-12 ENCOUNTER — Other Ambulatory Visit: Payer: Self-pay

## 2021-03-12 VITALS — BP 106/80 | HR 84 | Temp 97.4°F | Resp 16 | Ht 63.0 in | Wt 201.4 lb

## 2021-03-12 DIAGNOSIS — R1011 Right upper quadrant pain: Secondary | ICD-10-CM

## 2021-03-12 DIAGNOSIS — E876 Hypokalemia: Secondary | ICD-10-CM | POA: Diagnosis not present

## 2021-03-12 DIAGNOSIS — K760 Fatty (change of) liver, not elsewhere classified: Secondary | ICD-10-CM

## 2021-03-12 NOTE — Progress Notes (Signed)
[facility] [address] [place], [place] [address]  Internal MEDICINE  Office Visit Note  Patient Name: Natasha Hayes  [id number]  [id number]  Date of Service: 03/12/2021  Chief Complaint  Patient presents with  . Follow-up    Mild pain in right area but tender to touch, throwing up, diarrhea, gagging/dry heaving in the morning   . ADHD     HPI Patient is here for routine follow-up Has been seen in emergency department at [facility] for ongoing right upper quadrant pain, N/V and diarrhea Multiple images revealed slightly enlarged gallbladder and liver, both without evidence of acute infection, stones or obstruction Has canceled appointment with general surgeon referred to at previous visit Now requesting GI referral--prefer to see local provider  Has stopped seeing her psychiatrist because they would no longer provide her with ADHD medication due to positive UDS on multiple visits  Has also had second emergency department visit due to possible allergic reaction, she describes tingling and swelling to her lips and throat--reviewing ED physician note he felt allergic reaction was unlikely, did prescribe Epi pen as well as prednisone taper which she did not take  Current Medication: Outpatient Encounter Medications as of 03/12/2021  Medication Sig  . EPINEPHrine (EPIPEN 2-PAK) 0.3 mg/0.3 mL IJ SOAJ injection Inject 0.3 mg into the muscle as needed for anaphylaxis.  Marland Kitchen ibuprofen (ADVIL) 100 MG tablet Take 100 mg by mouth every 6 (six) hours as needed for fever or pain.  . predniSONE (DELTASONE) 20 MG tablet Take 1 tablet (20 mg total) by mouth daily with breakfast.  . [DISCONTINUED] azithromycin (ZITHROMAX Z-PAK) 250 MG tablet Take two 250 mg tablets on day one followed by one 250 mg tablet each day for four days. (Patient not taking: Reported on 03/12/2021)   No facility-administered encounter medications on file as of 03/12/2021.    Surgical History: History reviewed. No  pertinent surgical history.  Medical History: Past Medical History:  Diagnosis Date  . ADHD   . Insomnia   . Mood disorder (HCC)   . Renal disorder    kdiney stones    Family History: Family History  Problem Relation Age of Onset  . Cancer Paternal Grandfather        lung  . Depression Paternal Grandfather     Social History   Socioeconomic History  . Marital status: Single    Spouse name: Not on file  . Number of children: Not on file  . Years of education: Not on file  . Highest education level: Not on file  Occupational History  . Not on file  Tobacco Use  . Smoking status: Current Every Day Smoker    Packs/day: 1.00    Types: Cigarettes  . Smokeless tobacco: Never Used  Vaping Use  . Vaping Use: Never used  Substance and Sexual Activity  . Alcohol use: Not Currently  . Drug use: Yes    Types: Marijuana  . Sexual activity: Yes    Comment: nuva ring   Other Topics Concern  . Not on file  Social History Narrative  . Not on file   Social Determinants of Health   Financial Resource Strain: Not on file  Food Insecurity: Not on file  Transportation Needs: Not on file  Physical Activity: Not on file  Stress: Not on file  Social Connections: Not on file  Intimate Partner Violence: Not on file      Review of Systems  Constitutional: Negative for chills, diaphoresis and fatigue.  HENT: Negative for ear pain, postnasal drip and sinus pressure.   Eyes: Negative for photophobia, discharge, redness, itching and visual disturbance.  Respiratory: Negative for cough, shortness of breath and wheezing.   Cardiovascular: Negative for chest pain, palpitations and leg swelling.  Gastrointestinal: Positive for abdominal pain, diarrhea, nausea and vomiting. Negative for constipation.  Genitourinary: Negative for dysuria and flank pain.  Musculoskeletal: Negative for arthralgias, back pain, gait problem and neck pain.  Skin: Negative for color change.   Allergic/Immunologic: Negative for environmental allergies and food allergies.  Neurological: Negative for dizziness and headaches.  Hematological: Does not bruise/bleed easily.  Psychiatric/Behavioral: Negative for agitation, behavioral problems (depression) and hallucinations.    Vital Signs: BP 106/80   Pulse 84   Temp (!) 97.4 F (36.3 C)   Resp 16   Ht 5\' 3"  (1.6 m)   Wt 201 lb 6.4 oz (91.4 kg)   SpO2 98%   BMI 35.68 kg/m    Physical Exam Vitals reviewed.  Constitutional:      Appearance: Normal appearance. She is normal weight.  Cardiovascular:     Rate and Rhythm: Normal rate and regular rhythm.     Pulses: Normal pulses.     Heart sounds: Normal heart sounds.  Pulmonary:     Effort: Pulmonary effort is normal.     Breath sounds: Normal breath sounds.  Abdominal:     General: Abdomen is flat.     Palpations: Abdomen is soft.  Musculoskeletal:        General: Normal range of motion.     Cervical back: Normal range of motion.  Skin:    General: Skin is warm.  Neurological:     General: No focal deficit present.     Mental Status: She is alert and oriented to person, place, and time. Mental status is at baseline.  Psychiatric:        Mood and Affect: Mood normal.        Behavior: Behavior normal.        Thought Content: Thought content normal.        Judgment: Judgment normal.    Assessment/Plan: 1. Right upper quadrant pain Referral placed to GI for further evaluation and management, has canceled general surgery appointment - Ambulatory referral to Gastroenterology  2. Hepatic steatosis Referral to GI--declines excessive alcohol intake - Ambulatory referral to Gastroenterology  3. Hypokalemia Repeat labs from emergency department visit due to abnormal electrolytes, will treat accordingly - Comprehensive Metabolic Panel (CMET)  General Counseling: understanding of the findings of todays visit and agrees with plan of treatment. I have  discussed any further diagnostic evaluation that may be needed or ordered today. We also reviewed her medications today. she has been encouraged to call the office with any questions or concerns that should arise related to todays visit.    Orders Placed This Encounter  Procedures  . Comprehensive Metabolic Panel (CMET)  . Ambulatory referral to Gastroenterology   Time spent: 30 Minutes Time spent includes review of chart, medications, test results and follow-up plan with the patient.  This patient was seen by De Blanch AGNP-C in Collaboration with Dr Leeanne Deed as a part of collaborative care agreement     Lyndon Code. Harris AGNP-C Internal medicine

## 2021-03-13 ENCOUNTER — Encounter: Payer: Self-pay | Admitting: Hospice and Palliative Medicine

## 2021-03-14 ENCOUNTER — Ambulatory Visit: Payer: Medicaid Other | Admitting: Obstetrics and Gynecology

## 2021-03-14 ENCOUNTER — Telehealth: Payer: Self-pay

## 2021-03-14 LAB — COMPREHENSIVE METABOLIC PANEL
ALT: 61 IU/L — ABNORMAL HIGH (ref 0–32)
AST: 31 IU/L (ref 0–40)
Albumin/Globulin Ratio: 2.2 (ref 1.2–2.2)
Albumin: 4.9 g/dL (ref 3.9–5.0)
Alkaline Phosphatase: 64 IU/L (ref 42–106)
BUN/Creatinine Ratio: 14 (ref 9–23)
BUN: 10 mg/dL (ref 6–20)
Bilirubin Total: 0.5 mg/dL (ref 0.0–1.2)
CO2: 19 mmol/L — ABNORMAL LOW (ref 20–29)
Calcium: 9.8 mg/dL (ref 8.7–10.2)
Chloride: 105 mmol/L (ref 96–106)
Creatinine, Ser: 0.73 mg/dL (ref 0.57–1.00)
Globulin, Total: 2.2 g/dL (ref 1.5–4.5)
Glucose: 92 mg/dL (ref 65–99)
Potassium: 4.3 mmol/L (ref 3.5–5.2)
Sodium: 141 mmol/L (ref 134–144)
Total Protein: 7.1 g/dL (ref 6.0–8.5)
eGFR: 121 mL/min/{1.73_m2} (ref >59)

## 2021-03-14 NOTE — Telephone Encounter (Signed)
Pt advised labs are normal as per taylor

## 2021-03-14 NOTE — Progress Notes (Signed)
Labs reviewed, potassium has returned to normal. Work-up in process for elevated ALT.

## 2021-03-18 ENCOUNTER — Encounter: Payer: Self-pay | Admitting: *Deleted

## 2021-04-02 ENCOUNTER — Ambulatory Visit: Payer: Medicaid Other | Admitting: Hospice and Palliative Medicine

## 2021-04-03 ENCOUNTER — Other Ambulatory Visit: Payer: Self-pay

## 2021-04-03 ENCOUNTER — Encounter: Payer: Self-pay | Admitting: Hospice and Palliative Medicine

## 2021-04-03 ENCOUNTER — Ambulatory Visit (INDEPENDENT_AMBULATORY_CARE_PROVIDER_SITE_OTHER): Payer: Medicaid Other | Admitting: Hospice and Palliative Medicine

## 2021-04-03 VITALS — BP 118/70 | HR 104 | Temp 98.3°F | Resp 16 | Ht 63.0 in | Wt 203.2 lb

## 2021-04-03 DIAGNOSIS — M25562 Pain in left knee: Secondary | ICD-10-CM | POA: Diagnosis not present

## 2021-04-03 DIAGNOSIS — G8929 Other chronic pain: Secondary | ICD-10-CM | POA: Diagnosis not present

## 2021-04-03 DIAGNOSIS — R1011 Right upper quadrant pain: Secondary | ICD-10-CM

## 2021-04-03 MED ORDER — TETANUS-DIPHTH-ACELL PERTUSSIS 5-2-15.5 LF-MCG/0.5 IM SUSP: 0.5000 mL | Freq: Once | INTRAMUSCULAR | 0 refills | Status: AC

## 2021-04-03 MED ORDER — MELOXICAM 7.5 MG PO TABS: 7.5000 mg | ORAL_TABLET | Freq: Every day | ORAL | 0 refills | Status: DC

## 2021-04-03 MED ORDER — OMEPRAZOLE 20 MG PO CPDR: 20.0000 mg | DELAYED_RELEASE_CAPSULE | Freq: Every day | ORAL | 3 refills | Status: DC

## 2021-04-03 NOTE — Progress Notes (Signed)
[facility] [address] [place], [place] [address]  Internal MEDICINE  Office Visit Note  Patient Name: Natasha Hayes  [id number]  [id number]  Date of Service: 04/05/2021  Chief Complaint  Patient presents with  . Abdominal Pain    Right upper quadrant pain, has appt with GI may 3rd  . Rash    On right lower abdominal area  . Knee Pain    Left knee pain.     HPI Pt is here for a sick visit. Complaining of acute left knee pain, has had similar complaints in the past with normal [place] and xray imaging Pain is constant but worse with ambulation--feels that there is fluid collected on her knee and is requesting it to be removed  Continues to have upper right quadrant abdominal discomfort--has an appointment with GI next month Nausea and vomiting has improved   Current Medication:  Outpatient Encounter Medications as of 04/03/2021  Medication Sig  . EPINEPHrine (EPIPEN 2-PAK) 0.3 mg/0.3 mL IJ SOAJ injection Inject 0.3 mg into the muscle as needed for anaphylaxis.  Marland Kitchen ibuprofen (ADVIL) 100 MG tablet Take 100 mg by mouth every 6 (six) hours as needed for fever or pain.  . meloxicam (MOBIC) 7.5 MG tablet Take 1 tablet (7.5 mg total) by mouth daily.  Marland Kitchen omeprazole (PRILOSEC) 20 MG capsule Take 1 capsule (20 mg total) by mouth daily.  . [DISCONTINUED] predniSONE (DELTASONE) 20 MG tablet Take 1 tablet (20 mg total) by mouth daily with breakfast. (Patient not taking: Reported on 04/03/2021)   No facility-administered encounter medications on file as of 04/03/2021.      Medical History: Past Medical History:  Diagnosis Date  . ADHD   . Insomnia   . Mood disorder (HCC)   . Renal disorder    kdiney stones     Vital Signs: BP 118/70   Pulse (!) 104   Temp 98.3 F (36.8 C)   Resp 16   Ht 5\' 3"  (1.6 m)   Wt 203 lb 3.2 oz (92.2 kg)   SpO2 98%   BMI 36.00 kg/m    Review of Systems  Constitutional: Negative for chills, diaphoresis and fatigue.  HENT: Negative for  ear pain, postnasal drip and sinus pressure.   Eyes: Negative for photophobia, discharge, redness, itching and visual disturbance.  Respiratory: Negative for cough, shortness of breath and wheezing.   Cardiovascular: Negative for chest pain, palpitations and leg swelling.  Gastrointestinal: Positive for abdominal pain. Negative for constipation, diarrhea, nausea and vomiting.  Genitourinary: Negative for dysuria and flank pain.  Musculoskeletal: Negative for arthralgias, back pain, gait problem and neck pain.       Left knee pain  Skin: Negative for color change.  Allergic/Immunologic: Negative for environmental allergies and food allergies.  Neurological: Negative for dizziness and headaches.  Hematological: Does not bruise/bleed easily.  Psychiatric/Behavioral: Negative for agitation, behavioral problems (depression) and hallucinations.    Physical Exam Vitals reviewed.  Constitutional:      Appearance: Normal appearance. She is obese.  Cardiovascular:     Rate and Rhythm: Normal rate and regular rhythm.     Pulses: Normal pulses.     Heart sounds: Normal heart sounds.  Pulmonary:     Effort: Pulmonary effort is normal.     Breath sounds: Normal breath sounds.  Abdominal:     General: Abdomen is flat. There is no distension.     Palpations: Abdomen is soft.     Tenderness: There is no abdominal tenderness.  There is no guarding or rebound.  Musculoskeletal:        General: Normal range of motion.     Cervical back: Normal range of motion.     Left knee: Normal. No swelling. Normal range of motion. No tenderness.  Skin:    General: Skin is warm.  Neurological:     General: No focal deficit present.     Mental Status: She is alert and oriented to person, place, and time. Mental status is at baseline.  Psychiatric:        Mood and Affect: Mood normal.        Behavior: Behavior normal.        Thought Content: Thought content normal.        Judgment: Judgment normal.     Assessment/Plan: 1. Chronic pain of left knee Referral placed to ortho for further evaluation and management Start Meloxicam as well as PPI for prevention of further abdominal pain and GERD - meloxicam (MOBIC) 7.5 MG tablet; Take 1 tablet (7.5 mg total) by mouth daily.  Dispense: 30 tablet; Refill: 0 - omeprazole (PRILOSEC) 20 MG capsule; Take 1 capsule (20 mg total) by mouth daily.  Dispense: 30 capsule; Refill: 3 - Ambulatory referral to Orthopedic Surgery  2. Right upper quadrant pain Await referral to GI, trial PPI - omeprazole (PRILOSEC) 20 MG capsule; Take 1 capsule (20 mg total) by mouth daily.  Dispense: 30 capsule; Refill: 3  General Counseling: Natasha Hayes verbalizes understanding of the findings of todays visit and agrees with plan of treatment. I have discussed any further diagnostic evaluation that may be needed or ordered today. We also reviewed her medications today. she has been encouraged to call the office with any questions or concerns that should arise related to todays visit.   Orders Placed This Encounter  Procedures  . Ambulatory referral to Orthopedic Surgery    Meds ordered this encounter  Medications  . meloxicam (MOBIC) 7.5 MG tablet    Sig: Take 1 tablet (7.5 mg total) by mouth daily.    Dispense:  30 tablet    Refill:  0  . omeprazole (PRILOSEC) 20 MG capsule    Sig: Take 1 capsule (20 mg total) by mouth daily.    Dispense:  30 capsule    Refill:  3    Time spent: 25 Minutes  This patient was seen by Leeanne Deed AGNP-C in Collaboration with Dr Lyndon Code as a part of collaborative care agreement.  Lubertha Basque [facility] Internal Medicine

## 2021-04-05 ENCOUNTER — Encounter: Payer: Self-pay | Admitting: Hospice and Palliative Medicine

## 2021-04-18 ENCOUNTER — Telehealth: Payer: Self-pay

## 2021-04-18 ENCOUNTER — Encounter: Payer: Self-pay | Admitting: Hospice and Palliative Medicine

## 2021-04-18 NOTE — Telephone Encounter (Signed)
Pt called that she had ear infection advised her need appt she said she don't have ride advised her to go to Ed or urgent care and also we can send referral for ENT

## 2021-04-22 ENCOUNTER — Telehealth: Payer: Self-pay

## 2021-04-22 NOTE — Telephone Encounter (Signed)
Pt called and advised that Natasha Hayes gave her a prescription for Tdap vaccine and her pharmacy stated she will have a co-pay.  I advised for pt to try calling the health dept and see if she can get the vaccine there at no cost.

## 2021-04-24 ENCOUNTER — Telehealth: Payer: Self-pay

## 2021-04-26 ENCOUNTER — Telehealth: Payer: Self-pay

## 2021-04-26 NOTE — Telephone Encounter (Signed)
Patient was referred to [facility]. Had appointment scheduled for 04/30/21. Received call today from Haze Justin w/ [facility] stating patient was seen at [facility] on 04/22/21 and will need to cancel appointment w/ [facility]. I gave tele # so patient can call to cancel her appointment.-Toni

## 2021-04-30 ENCOUNTER — Ambulatory Visit: Payer: Medicaid Other | Admitting: Gastroenterology

## 2021-05-01 ENCOUNTER — Ambulatory Visit: Payer: Medicaid Other | Admitting: Hospice and Palliative Medicine

## 2021-05-06 ENCOUNTER — Ambulatory Visit: Payer: Self-pay

## 2021-05-14 ENCOUNTER — Encounter: Payer: Self-pay | Admitting: Nurse Practitioner

## 2021-05-14 ENCOUNTER — Ambulatory Visit (INDEPENDENT_AMBULATORY_CARE_PROVIDER_SITE_OTHER): Payer: Medicaid Other | Admitting: Nurse Practitioner

## 2021-05-14 DIAGNOSIS — F17200 Nicotine dependence, unspecified, uncomplicated: Secondary | ICD-10-CM | POA: Diagnosis not present

## 2021-05-14 DIAGNOSIS — E1165 Type 2 diabetes mellitus with hyperglycemia: Secondary | ICD-10-CM | POA: Diagnosis not present

## 2021-05-14 DIAGNOSIS — F1020 Alcohol dependence, uncomplicated: Secondary | ICD-10-CM | POA: Diagnosis not present

## 2021-05-14 DIAGNOSIS — F321 Major depressive disorder, single episode, moderate: Secondary | ICD-10-CM

## 2021-05-14 DIAGNOSIS — Z6835 Body mass index (BMI) 35.0-35.9, adult: Secondary | ICD-10-CM | POA: Diagnosis not present

## 2021-05-14 MED ORDER — METFORMIN HCL 500 MG PO TABS: 500.0000 mg | ORAL_TABLET | Freq: Two times a day (BID) | ORAL | 3 refills | Status: DC

## 2021-05-14 MED ORDER — SEMAGLUTIDE(0.25 OR 0.5MG/DOS) 2 MG/1.5ML ~~LOC~~ SOPN: 0.2500 mg | PEN_INJECTOR | SUBCUTANEOUS | 0 refills | Status: DC

## 2021-05-14 MED ORDER — BUPROPION HCL ER (XL) 150 MG PO TB24: 150.0000 mg | ORAL_TABLET | Freq: Every day | ORAL | 1 refills | Status: DC

## 2021-05-14 NOTE — Progress Notes (Signed)
[facility] [address] [place], [place] [address]  Internal MEDICINE  Telephone Visit  Patient Name: Natasha Hayes  [id number]  [id number]  Date of Service: 05/18/2021  I connected with the patient at 11:57 AM by tele and verified the patients identity using two identifiers.   I discussed the limitations, risks, security and privacy concerns of performing an evaluation and management service by telephone and the availability of in person appointments. I also discussed with the patient that there may be a patient responsible charge related to the service.  The patient expressed understanding and agrees to proceed.    Chief Complaint  Patient presents with  . Telephone Assessment    [id number]  . Telephone Screen  . Follow-up    Labs   . ADHD    HPI Natasha Hayes presents for tele visit to discuss new diagnosis of diabetes. She was initially scheduled for an appointment in-person but was unable to get transportation. She does not have a car. She has a significant other that she stays with sometimes and he does not have a car either.  -She drinks alcohol daily, usually Hennessy scotch liquor. She cannot provide a number of drinks per day but states that it is several. She reports that she cannot stop drinking and needs help with that but would prefer not to be admitted into a facility as an inpatient.  -A1C was 6.4 on 04/22/21. She eats a lot of sweets, sandwiches and snacks. She also drinks regular sodas and gatorade. No prior history of diabetes. -Her weight is 200 lbs. She is 5'3" tall and her BMI is 35.43. Natasha Hayes states she would like help with losing weight.  -She is a current every day smoker. She wants to quit but is having difficulty.  -She smokes marijuana daily per her report. She states she will not stop this habit and wants to get a green card once it becomes legalized in the state of [place].  -Natasha Hayes has a depressed mood, states no one cares about her or what  happens to her. She states she has felt sad for a long time. She does not go out a lot and often stay at home. She overeats emotionally as well as for boredom. Reports that she does cut herself but does not have thoughts of suicide. She contracted for safety over the virtual visit. She stated that she will be staying with her significant other tonight and he has been very supportive to her. She stated she would talk to him, call the clinic or call 911 if she has suicidal thoughts or does not feel safe from herself.  -Loose watery stools, several BMs usually 2-7. Chronic issue never completely addressed. This problem will most likely be addressed at a follow up visit. Reports her stool vary from soft and mushy to loose and watery; reports that it does seem to be aggravated by foods that she eats like greasy foods but she is not sure what other foods make it worse.   Current Medication: Outpatient Encounter Medications as of 05/14/2021  Medication Sig  . EPINEPHrine (EPIPEN 2-PAK) 0.3 mg/0.3 mL IJ SOAJ injection Inject 0.3 mg into the muscle as needed for anaphylaxis.  Marland Kitchen ibuprofen (ADVIL) 100 MG tablet Take 100 mg by mouth every 6 (six) hours as needed for fever or pain.  . meloxicam (MOBIC) 7.5 MG tablet Take 1 tablet (7.5 mg total) by mouth daily.  Marland Kitchen omeprazole (PRILOSEC) 20 MG capsule Take 1 capsule (20 mg total) by  mouth daily.  . [DISCONTINUED] buPROPion (WELLBUTRIN XL) 150 MG 24 hr tablet Take 1 tablet (150 mg total) by mouth daily.  . [DISCONTINUED] metFORMIN (GLUCOPHAGE) 500 MG tablet Take 1 tablet (500 mg total) by mouth 2 (two) times daily with a meal.  . [DISCONTINUED] Semaglutide,0.25 or 0.5MG /DOS, 2 MG/1.5ML SOPN Inject 0.25 mg into the skin once a week.   No facility-administered encounter medications on file as of 05/14/2021.    Surgical History: Past Surgical History:  Procedure Laterality Date  . NO PAST SURGERIES      Medical History: Past Medical History:  Diagnosis Date  .  ADHD   . Insomnia   . Mood disorder (HCC)   . Renal disorder    kdiney stones    Family History: Family History  Problem Relation Age of Onset  . Cancer Paternal Grandfather        lung  . Depression Paternal Grandfather     Social History   Socioeconomic History  . Marital status: Single    Spouse name: Not on file  . Number of children: Not on file  . Years of education: Not on file  . Highest education level: Not on file  Occupational History  . Not on file  Tobacco Use  . Smoking status: Current Every Day Smoker    Packs/day: 1.00    Types: Cigarettes  . Smokeless tobacco: Never Used  Vaping Use  . Vaping Use: Never used  Substance and Sexual Activity  . Alcohol use: Yes    Alcohol/week: 35.0 standard drinks    Types: 35 Shots of liquor per week    Comment: drinks scotch, several drinks throughout the day, 5 per day is estimation  . Drug use: Yes    Frequency: 7.0 times per week    Types: Marijuana    Comment: uses daily at least once or more per day  . Sexual activity: Yes    Birth control/protection: Inserts    Comment: nuva ring   Other Topics Concern  . Not on file  Social History Narrative  . Not on file   Social Determinants of Health   Financial Resource Strain: Medium Risk  . Difficulty of Paying Living Expenses: Somewhat hard  Food Insecurity: Not on file  Transportation Needs: Unmet Transportation Needs  . Lack of Transportation (Medical): Yes  . Lack of Transportation (Non-Medical): Yes  Physical Activity: Inactive  . Days of Exercise per Week: 0 days  . Minutes of Exercise per Session: 0 min  Stress: Stress Concern Present  . Feeling of Stress : Very much  Social Connections: Socially Isolated  . Frequency of Communication with Friends and Family: Never  . Frequency of Social Gatherings with Friends and Family: Never  . Attends Religious Services: Never  . Active Member of Clubs or Organizations: No  . Attends Banker  Meetings: Never  . Marital Status: Never married  Intimate Partner Violence: Not on file      Review of Systems  Constitutional: Positive for fatigue.  Respiratory: Negative.   Cardiovascular: Negative.   Gastrointestinal: Positive for diarrhea.       Loose stools, ongoing problem per patient.   Endocrine: Positive for polydipsia and polyphagia.  Psychiatric/Behavioral: Positive for behavioral problems, decreased concentration, dysphoric mood, self-injury and sleep disturbance. Negative for suicidal ideas. The patient is nervous/anxious.     Vital Signs: Ht 5\' 3"  (1.6 m)   Wt 200 lb (90.7 kg)   BMI 35.43 kg/m  Observation/Objective: Lauree is pleasant and engages in conversation of virtual video visit. She had moments of tearfulness, sad effect, rapid speech, depressed mood. She asks questions and verbalizes when she does not understand something. She is holding a bottle of Facilities manager.     Assessment/Plan:  1. Type 2 diabetes mellitus with hyperglycemia, without long-term current use of insulin (HCC) Diamon has new onset type 2 diabetes. Her A1C was 6.4. Her BMI was 35.43. weight is 200 lbs. She drinks alcohol daily, smokes cigarettes and eats foods high is sugar and carbohydrates. She reports that she needs help figuring out how to eat healthy. Referral to medical nutrition therapy placed. Ame does have transportation issues currently but she is working on fixing this problem. Started on metformin and semaglutide. Samples of semaglutide will be set aside for the patient if available for her to pick up at her earliest convenience.  - Amb ref to Medical Nutrition Therapy-MNT -Semaglutide,0.25 or 0.5MG /DOS, 2 MG/1.5ML SOPN;  Inject 0.25 mg into the skin once a week.; Dispense 3 ml; Ref: 0 -metFORMIN (GLUCOPHAGE) 500 MG tablet; Take 1 tablet (500 mg total) by mouth 2 (two) times daily with a meal.; Dispennse: 180 tablets; Ref: 3.   2. BMI 35.0-35.9,adult BMI is  elevated, lowering her BMI will help decrease her glucose levels. Discussed diet and exercise. Discussed unhealthy habits of alcohol and tobacco use. Referred to medical nutrition therapy. Discussed decreasing calorie intake to 1800 calories per day. This visit is difficult to do virtually so will review this information with the patient at her next in-person appointment. Obesity Counseling: Risk Assessment: An assessment of behavioral risk factors was made today and includes lack of exercise sedentary lifestyle, lack of portion control and poor dietary habits.  Risk Modification Advice: She was counseled on portion control guidelines. Restricting daily caloric intake to 1800. The detrimental long term effects of obesity on her health and ongoing poor compliance was also discussed with the patient. - Amb ref to Medical Nutrition Therapy-MNT  3. Alcohol dependence, daily use (HCC) Saphia drinks scotch liquor daily. She does not pour a shot or a glass but reports she drinks the equivalent of several drinks daily from the bottle. She is an emotional drinker as well. She reports wanting help to quit but not being able to on her own. She wants to find an outpatient program to do but would prefer not to be admitted to an inpatient program or rehab center. Referred to a behavioral health intensive outpatient program.  - Ambulatory referral to [facility] Intensive OP Program  4. Current moderate episode of major depressive disorder, unspecified whether recurrent (HCC) Cristabel is depressed. She has thoughts of hopelessness, and worthlessness, her energy level and movement is decreased, she states "no one cares about me". She does self-mutilate through cutting but does not have thoughts of self-harm. Started patient on wellbutrin, discussed decreasing alcohol intake and trying to taper off the alcohol to prevent alcohol withdrawal. Discussed with patient the interaction that alcohol can have with antidepressant medications.   -buPROPion (WELLBUTRIN XL) 150 MG 24 hr tablet; Take 1 tablet (150 mg total) by mouth daily.; Dispense: 30 tablet; Ref:1  5. Tobacco dependence Simon is a 1ppd smoker. Discussed with Jennavecia the use of wellbutrin for depression and the added benefit of decreasing her cravings to smoke. Discussed the risks and complications that smoking cigarettes can have if she continues to smoke cigarettes or use tobacco products.  -buPROPion (WELLBUTRIN XL) 150 MG 24 hr tablet; Take  1 tablet (150 mg total) by mouth daily.; Dispense: 30 tablet; Ref:1  General Counseling: Alleta verbalizes understanding of the findings of today's phone visit and agrees with plan of treatment. I have discussed any further diagnostic evaluation that may be needed or ordered today. We also reviewed her medications today. she has been encouraged to call the office with any questions or concerns that should arise related to todays visit.    Orders Placed This Encounter  Procedures  . [facility]  . Amb ref to Medical Nutrition Therapy-MNT    Meds ordered this encounter  Medications  . DISCONTD: buPROPion (WELLBUTRIN XL) 150 MG 24 hr tablet    Sig: Take 1 tablet (150 mg total) by mouth daily.    Dispense:  30 tablet    Refill:  1  . DISCONTD: metFORMIN (GLUCOPHAGE) 500 MG tablet    Sig: Take 1 tablet (500 mg total) by mouth 2 (two) times daily with a meal.    Dispense:  180 tablet    Refill:  3  . DISCONTD: Semaglutide,0.25 or 0.5MG /DOS, 2 MG/1.5ML SOPN    Sig: Inject 0.25 mg into the skin once a week.    Dispense:  3 mL    Refill:  0   Return in about 1 month (around 06/14/2021) for F/U diabetes management , Kendahl Bumgardner PCP.  Time spent: 30 Minutes  This patient was seen by Sallyanne KusterAlyssa Aqsa Sensabaugh, FNP-C in collaboration with Dr. Beverely RisenFozia Khan as a part of collaborative care agreement.   Dr Lyndon CodeFozia M Khan Internal medicine

## 2021-05-15 ENCOUNTER — Telehealth: Payer: Self-pay

## 2021-05-15 ENCOUNTER — Encounter: Payer: Self-pay | Admitting: Nurse Practitioner

## 2021-05-15 NOTE — Telephone Encounter (Signed)
Make sure we have samples

## 2021-05-15 NOTE — Telephone Encounter (Signed)
Pt advised we samples ready for pickup for ozempic

## 2021-05-16 ENCOUNTER — Other Ambulatory Visit: Payer: Self-pay

## 2021-05-16 ENCOUNTER — Encounter: Payer: Self-pay | Admitting: Nurse Practitioner

## 2021-05-16 MED ORDER — ACCU-CHEK SOFTCLIX LANCETS MISC: 12 refills | Status: DC

## 2021-05-16 MED ORDER — SEMAGLUTIDE(0.25 OR 0.5MG/DOS) 2 MG/1.5ML ~~LOC~~ SOPN: 0.2500 mg | PEN_INJECTOR | SUBCUTANEOUS | 3 refills | Status: DC

## 2021-05-16 MED ORDER — ACCU-CHEK GUIDE VI STRP: ORAL_STRIP | 12 refills | Status: DC

## 2021-05-16 MED ORDER — BUPROPION HCL ER (XL) 150 MG PO TB24: 150.0000 mg | ORAL_TABLET | Freq: Every day | ORAL | 1 refills | Status: DC

## 2021-05-16 MED ORDER — ACCU-CHEK GUIDE W/DEVICE KIT
PACK | 3 refills | Status: DC
Start: 2021-05-16 — End: 2021-10-02

## 2021-05-16 MED ORDER — METFORMIN HCL 500 MG PO TABS: 500.0000 mg | ORAL_TABLET | Freq: Two times a day (BID) | ORAL | 3 refills | Status: DC

## 2021-05-18 ENCOUNTER — Encounter: Payer: Self-pay | Admitting: Nurse Practitioner

## 2021-05-20 ENCOUNTER — Telehealth: Payer: Self-pay

## 2021-05-20 NOTE — Telephone Encounter (Signed)
LMOM to schedule a nurse visit to come in and learn how to use glucose monitor and get a kit

## 2021-05-20 NOTE — Telephone Encounter (Signed)
-----   Message from Sallyanne Kuster, NP sent at 05/18/2021  8:46 PM EDT ----- Regarding: glucose monitoring Hi ladies,  Samari is a new diabetic. I started her on ozempic and metformin this past week. She needs to learn how to check her glucose levels and will need a monitor. I notified her of this via mychart message but please call her to see when she would be able to come in to learn how to do it and take her glucose monitor home.   Thank you, Alyssa

## 2021-05-20 NOTE — Telephone Encounter (Signed)
Lvm @ 11:15 for patient to return my call regarding referral-Toni

## 2021-05-22 ENCOUNTER — Telehealth: Payer: Self-pay

## 2021-06-06 ENCOUNTER — Encounter: Payer: Self-pay | Admitting: Nurse Practitioner

## 2021-06-06 ENCOUNTER — Ambulatory Visit (INDEPENDENT_AMBULATORY_CARE_PROVIDER_SITE_OTHER): Payer: Medicaid Other | Admitting: Nurse Practitioner

## 2021-06-06 VITALS — Resp 16 | Ht 63.0 in | Wt 185.0 lb

## 2021-06-06 DIAGNOSIS — Z6835 Body mass index (BMI) 35.0-35.9, adult: Secondary | ICD-10-CM

## 2021-06-06 DIAGNOSIS — E1165 Type 2 diabetes mellitus with hyperglycemia: Secondary | ICD-10-CM | POA: Diagnosis not present

## 2021-06-06 DIAGNOSIS — F17200 Nicotine dependence, unspecified, uncomplicated: Secondary | ICD-10-CM

## 2021-06-06 DIAGNOSIS — F1020 Alcohol dependence, uncomplicated: Secondary | ICD-10-CM

## 2021-06-06 DIAGNOSIS — F321 Major depressive disorder, single episode, moderate: Secondary | ICD-10-CM | POA: Diagnosis not present

## 2021-06-06 NOTE — Progress Notes (Signed)
[facility] [place], [place] [address]  Internal MEDICINE  Telephone Visit  Patient Name: Natasha Hayes  [id number]  [id number]  Date of Service: 06/12/2021  I connected with the patient at 10:30 AM by telephone and verified the patients identity using two identifiers.   I discussed the limitations, risks, security and privacy concerns of performing an evaluation and management service by telephone and the availability of in person appointments. I also discussed with the patient that there may be a patient responsible charge related to the service.  The patient expressed understanding and agrees to proceed.    Chief Complaint  Patient presents with   Acute Visit    Dizzy spells for past couple days, and trouble sleeping, sugar was 259 last night, discuss meds, wants a sensor to scan, discuss gallbladder    Telephone Screen    Phone call   Telephone Assessment    [phone]   Quality Metric Gaps    Pneumovax     HPI Natasha Hayes presents via virtual video for follow up visit to discuss diabetes, weight loss, and substance abuse.  Natasha Hayes has had problems with transportation in the past and she currently does not have a car.  She was unable to come to her visit in person so it was made virtual.  She was previously seen on May 17 via virtual visit for type 2 diabetes alcohol abuse, tobacco dependence, depression and morbid obesity.  In May she was started on metformin 500 mg twice daily and Ozempic 0.25 mg injection weekly for type 2 diabetes.  She was also started on bupropion 150 mg daily for depression and for smoking cessation. -At her previous visit in May, Natasha Hayes reported that she drinks several shots of liquor (scotch) daily.  She reports smoking a pack a day of cigarettes and smoking weed daily.  She reports that she got rid of all of the alcohol in her house and she has been clean for 3 weeks and since starting the bupropion she reports that she has decreased the  amount of cigarettes she smokes per day.  She also reports that she feels like she has lost some weight she does not have a scale at home and is unable to weigh herself but she does feel like her clothes have started to fit better. Natasha Hayes did report 2 episodes of low blood sugar at 43 and at 56 and she did report falling when this happened and hitting her head.  She denies any current pain.  Discussed with patient making sure she is eating a good protein snack between meals.  She reports focusing on eating better and moving around more.  -discussed with patient having the next office visit in person in 2 weeks. The patient was at her adoptive mother's work place and had her come on the video to discuss the ideal time to schedule an office visit when her adoptive mother can bring her to the office.  -She is excited about her biological mother coming to visit soon and being able to spend some time with her.    Current Medication: Outpatient Encounter Medications as of 06/06/2021  Medication Sig   [DISCONTINUED] buPROPion (WELLBUTRIN XL) 300 MG 24 hr tablet Take 1 tablet (300 mg total) by mouth daily.   [DISCONTINUED] Continuous Blood Gluc Sensor (FREESTYLE LIBRE 14 DAY SENSOR) MISC Apply sensor to skin as instructed, change every 14 days. Use app on smartphone to check glucose level throughout the day   Accu-Chek Softclix  Lancets lancets Use as instructed to check blood sugars twice a day  E11.65   Blood Glucose Monitoring Suppl (ACCU-CHEK GUIDE) w/Device KIT Use as directed to check blood sugar twice a day E11.65   buPROPion (WELLBUTRIN XL) 300 MG 24 hr tablet Take 1 tablet (300 mg total) by mouth daily.   Continuous Blood Gluc Sensor (FREESTYLE LIBRE 14 DAY SENSOR) MISC Apply sensor to skin as instructed, change every 14 days. Use app on smartphone to check glucose level throughout the day   EPINEPHrine (EPIPEN 2-PAK) 0.3 mg/0.3 mL IJ SOAJ injection Inject 0.3 mg into the muscle as needed for  anaphylaxis.   glucose blood (ACCU-CHEK GUIDE) test strip Use as instructed to check blood sugars twice a day  E11.65   ibuprofen (ADVIL) 100 MG tablet Take 100 mg by mouth every 6 (six) hours as needed for fever or pain.   meloxicam (MOBIC) 7.5 MG tablet Take 1 tablet (7.5 mg total) by mouth daily.   metFORMIN (GLUCOPHAGE) 500 MG tablet Take 1 tablet (500 mg total) by mouth daily with supper.   omeprazole (PRILOSEC) 20 MG capsule Take 1 capsule (20 mg total) by mouth daily.   Semaglutide,0.25 or 0.5MG/DOS, 2 MG/1.5ML SOPN Inject 0.25 mg into the skin once a week.   [DISCONTINUED] buPROPion (WELLBUTRIN XL) 150 MG 24 hr tablet Take 1 tablet (150 mg total) by mouth daily.   [DISCONTINUED] metFORMIN (GLUCOPHAGE) 500 MG tablet Take 1 tablet (500 mg total) by mouth 2 (two) times daily with a meal.   [DISCONTINUED] metFORMIN (GLUCOPHAGE) 500 MG tablet Take 1 tablet (500 mg total) by mouth daily with supper.   No facility-administered encounter medications on file as of 06/06/2021.    Surgical History: Past Surgical History:  Procedure Laterality Date   NO PAST SURGERIES      Medical History: Past Medical History:  Diagnosis Date   ADHD    Diabetes mellitus without complication ([place])    Insomnia    Mood disorder (HCC)    Renal disorder    kdiney stones    Family History: Family History  Problem Relation Age of Onset   Cancer Paternal Grandfather        lung   Depression Paternal Grandfather     Social History   Socioeconomic History   Marital status: Single    Spouse name: Not on file   Number of children: Not on file   Years of education: Not on file   Highest education level: Not on file  Occupational History   Not on file  Tobacco Use   Smoking status: Every Day    Packs/day: 1.00    Pack years: 0.00    Types: Cigarettes   Smokeless tobacco: Never  Vaping Use   Vaping Use: Never used  Substance and Sexual Activity   Alcohol use: Not Currently    Alcohol/week: 35.0  standard drinks    Types: 35 Shots of liquor per week    Comment: drinks scotch, several drinks throughout the day, 5 per day is estimation   Drug use: Yes    Frequency: 7.0 times per week    Types: Marijuana    Comment: uses daily at least once or more per day   Sexual activity: Yes    Birth control/protection: Inserts    Comment: nuva ring   Other Topics Concern   Not on file  Social History Narrative   Not on file   Social Determinants of Health   Financial Resource Strain:  Medium Risk   Difficulty of Paying Living Expenses: Somewhat hard  Food Insecurity: Not on file  Transportation Needs: Unmet Transportation Needs   Lack of Transportation (Medical): Yes   Lack of Transportation (Non-Medical): Yes  Physical Activity: Inactive   Days of Exercise per Week: 0 days   Minutes of Exercise per Session: 0 min  Stress: Stress Concern Present   Feeling of Stress : Very much  Social Connections: Socially Isolated   Frequency of Communication with Friends and Family: Never   Frequency of Social Gatherings with Friends and Family: Never   Attends Religious Services: Never   Marine scientist or Organizations: No   Attends Archivist Meetings: Never   Marital Status: Never married  Human resources officer Violence: Not on file      Review of Systems  Constitutional:  Positive for appetite change (decreased due to diabetic medications). Negative for chills, fatigue and fever.  HENT:  Negative for congestion and ear pain.   Eyes: Negative.   Respiratory:  Negative for cough, chest tightness, shortness of breath and wheezing.   Cardiovascular:  Negative for chest pain.  Gastrointestinal: Negative.  Negative for abdominal pain, blood in stool, constipation, diarrhea, nausea and vomiting.  Genitourinary: Negative.   Musculoskeletal: Negative.  Negative for arthralgias and myalgias.  Skin: Negative.   Neurological:  Positive for dizziness, light-headedness and headaches.   Psychiatric/Behavioral:  Negative for behavioral problems and dysphoric mood. The patient is not nervous/anxious.    Vital Signs: Resp 16   Ht '5\' 3"'  (1.6 m)   Wt 185 lb (83.9 kg)   BMI 32.77 kg/m    Observation/Objective: The patient is alert and oriented during the virtual video visit. She is pleasant, cooperative, engages in conversation appropriately. She appears to be in no acute distress.    Assessment/Plan: 1. Type 2 diabetes mellitus with hyperglycemia, without long-term current use of insulin (HCC) Natasha Hayes is currently taking Ozempic injections weekly and metformin twice daily.  She has had 2 episodes of symptomatic hypoglycemia.  She reports having issues with keeping track of her sugars on a consistent basis.  Freestyle libre sensor has been ordered for the patient and a sample was provided for the patient to pick up at the office.  Due to hypoglycemic episodes which occurred in the early afternoon both times, the metformin dose was decreased from twice a day to once a day with supper.  We will follow-up with patient in 2 weeks to see how the freestyle libre sensor is working for as well as what her range of sugars have been to assess if there has been any improvement with her Ozempic and metformin. - metFORMIN (GLUCOPHAGE) 500 MG tablet; Take 1 tablet (500 mg total) by mouth daily with supper.  Dispense: 180 tablet; Refill: 3 - Continuous Blood Gluc Sensor (FREESTYLE LIBRE 14 DAY SENSOR) MISC; Apply sensor to skin as instructed, change every 14 days. Use app on smartphone to check glucose level throughout the day  Dispense: 1 each; Refill: 11  2. BMI 35.0-35.9,adult The patient is interested in losing weight she was started on Ozempic for type 2 diabetes which has the potential to help her lose weight.  She is also working on diet modifications including portion control, moderation, low sugar, low-carb options and increasing lean protein in her diet.  She is working on being more  active moving around more she was instructed not to actively start a workout plan until she has lost more weight so that  she does not tire out and give up.  3. Current moderate episode of major depressive disorder, unspecified whether recurrent ([place]) At her previous virtual visit, her depression was discussed at length.  She has had thoughts of worthlessness and hopelessness.  She has voiced that no one truly cares about her.  She reports problems with her Education officer, museum and a history of self-mutilation by cutting she was started on bupropion 150 mg at her previous visit to help with the depressive symptoms as well as smoking cessation.  She was smoking a pack per day of cigarettes.  The patient reports that the bupropion is helping but not as much as she was hoping it would.  Increase bupropion to 300 mg daily. - buPROPion (WELLBUTRIN XL) 300 MG 24 hr tablet; Take 1 tablet (300 mg total) by mouth daily.  Dispense: 30 tablet; Refill: 2  4. Alcohol dependence, daily use ([place]) At her last virtual visit Natasha Hayes talked about drinking Hennessy scotch on a daily basis.  She is currently underage and cannot legally drink.  Her alcohol dependence is complicated by depression and anxiety and thoughts of wanting to harm herself in the past.  At her previous virtual visit she also reported a history of cutting but not in a sense of wanting to kill herself.  As of today, Natasha Hayes reports that she has gotten rid of all the alcohol in her house and she has been sober for 3 weeks.  She reports being very proud of herself and she was acknowledged and complemented on her success thus far and encouraged to continue staying away from alcohol.  5. Tobacco dependence Natasha Hayes has a history of smoking 1 pack/day of cigarettes.  At her prior virtual visit, she was prescribed bupropion to help her with smoking cessation which she conveyed that she had an interest in.  As of her virtual visit today, she reports that she has decreased the  amount of cigarettes that she smokes and is craving them less.  She reports that she is down to about 1/2 pack/day and she is motivated to continue her journey and smoking cessation.    General Counseling: keyanni whittinghill understanding of the findings of today's phone visit and agrees with plan of treatment. I have discussed any further diagnostic evaluation that may be needed or ordered today. We also reviewed her medications today. she has been encouraged to call the office with any questions or concerns that should arise related to todays visit.    No orders of the defined types were placed in this encounter.   Meds ordered this encounter  Medications   DISCONTD: buPROPion (WELLBUTRIN XL) 300 MG 24 hr tablet    Sig: Take 1 tablet (300 mg total) by mouth daily.    Dispense:  30 tablet    Refill:  2   DISCONTD: Continuous Blood Gluc Sensor (FREESTYLE LIBRE 14 DAY SENSOR) MISC    Sig: Apply sensor to skin as instructed, change every 14 days. Use app on smartphone to check glucose level throughout the day    Dispense:  1 each    Refill:  11   DISCONTD: metFORMIN (GLUCOPHAGE) 500 MG tablet    Sig: Take 1 tablet (500 mg total) by mouth daily with supper.    Dispense:  180 tablet    Refill:  3   metFORMIN (GLUCOPHAGE) 500 MG tablet    Sig: Take 1 tablet (500 mg total) by mouth daily with supper.    Dispense:  180 tablet  Refill:  3   Continuous Blood Gluc Sensor (FREESTYLE LIBRE 14 DAY SENSOR) MISC    Sig: Apply sensor to skin as instructed, change every 14 days. Use app on smartphone to check glucose level throughout the day    Dispense:  1 each    Refill:  11   buPROPion (WELLBUTRIN XL) 300 MG 24 hr tablet    Sig: Take 1 tablet (300 mg total) by mouth daily.    Dispense:  30 tablet    Refill:  2   Return in about 2 weeks (around 06/20/2021) for F/U, Weight loss, diabetes, depression, Marrion Finan PCP in person.   Time spent:30 Minutes  This patient was seen by Jonetta Osgood,  FNP-C in collaboration with Dr. Clayborn Bigness as a part of collaborative care agreement.   Anedra Penafiel R. Valetta Fuller, MSN, FNP-C Internal medicine

## 2021-06-07 MED ORDER — BUPROPION HCL ER (XL) 300 MG PO TB24: 300.0000 mg | ORAL_TABLET | Freq: Every day | ORAL | 2 refills | Status: DC

## 2021-06-07 MED ORDER — METFORMIN HCL 500 MG PO TABS: 500.0000 mg | ORAL_TABLET | Freq: Every day | ORAL | 3 refills | Status: DC

## 2021-06-07 MED ORDER — METFORMIN HCL 500 MG PO TABS
500.0000 mg | ORAL_TABLET | Freq: Every day | ORAL | 3 refills | Status: DC
Start: 2021-06-07 — End: 2021-06-13

## 2021-06-07 MED ORDER — FREESTYLE LIBRE 14 DAY SENSOR MISC: 11 refills | Status: DC

## 2021-06-11 ENCOUNTER — Telehealth: Payer: Self-pay

## 2021-06-11 NOTE — Telephone Encounter (Signed)
Carbohydrates should be in grams. Target glucose range is 90-140

## 2021-06-13 ENCOUNTER — Other Ambulatory Visit: Payer: Self-pay | Admitting: Nurse Practitioner

## 2021-06-13 ENCOUNTER — Encounter: Payer: Self-pay | Admitting: Nurse Practitioner

## 2021-06-13 ENCOUNTER — Ambulatory Visit: Payer: Medicaid Other | Admitting: Nurse Practitioner

## 2021-06-13 ENCOUNTER — Telehealth: Payer: Self-pay

## 2021-06-13 MED ORDER — TRAZODONE HCL 100 MG PO TABS: 100.0000 mg | ORAL_TABLET | Freq: Every day | ORAL | 0 refills | Status: DC

## 2021-06-13 NOTE — Progress Notes (Signed)
Patient reports she is still having hypoglycemic episodes, metformin discontinued.  Will decrease ozempic to 0.25 mg injected weekly. Patient is still reporting having difficulty sleeping will trial trazodone and see if this works for her. Will send patient a message and let her know of these changes.

## 2021-06-13 NOTE — Telephone Encounter (Signed)
Awesome!

## 2021-06-14 NOTE — Telephone Encounter (Signed)
Issues addressed with provider and pt

## 2021-06-20 ENCOUNTER — Ambulatory Visit: Payer: Medicaid Other | Admitting: Nurse Practitioner

## 2021-06-21 ENCOUNTER — Other Ambulatory Visit: Payer: Self-pay

## 2021-06-21 DIAGNOSIS — F321 Major depressive disorder, single episode, moderate: Secondary | ICD-10-CM

## 2021-06-21 MED ORDER — DEXCOM G6 RECEIVER DEVI: 0 refills | Status: DC

## 2021-06-21 MED ORDER — DEXCOM G6 SENSOR MISC: 3 refills | Status: DC

## 2021-06-21 MED ORDER — DEXCOM G6 TRANSMITTER MISC: 0 refills | Status: DC

## 2021-06-21 NOTE — Telephone Encounter (Signed)
Send dexcom due to free style is not covered

## 2021-06-27 ENCOUNTER — Ambulatory Visit: Payer: Medicaid Other | Admitting: *Deleted

## 2021-06-28 ENCOUNTER — Telehealth: Payer: Self-pay

## 2021-06-28 NOTE — Telephone Encounter (Signed)
Spoke to pt and asked for her to send a copy of her insurance card thru [facility].  Pt advised she would do that.

## 2021-07-04 ENCOUNTER — Ambulatory Visit: Payer: Medicaid Other | Admitting: *Deleted

## 2021-07-22 ENCOUNTER — Encounter: Payer: Medicaid Other | Admitting: Nurse Practitioner

## 2021-09-17 ENCOUNTER — Telehealth: Payer: Self-pay

## 2021-09-17 NOTE — Telephone Encounter (Signed)
Patient called to make an appt to check on her diabetes. She stated that she also needs to have a bumb under her breast looked at and has a possible UTI. Patient didn't have transportation until 10/02/2021, I advised if she was able to come in sooner for her issues to give [place] a call so the issues do not worsen.

## 2021-10-02 ENCOUNTER — Ambulatory Visit: Payer: Medicaid Other | Admitting: Nurse Practitioner

## 2021-10-02 ENCOUNTER — Other Ambulatory Visit: Payer: Self-pay

## 2021-10-02 ENCOUNTER — Encounter: Payer: Self-pay | Admitting: Nurse Practitioner

## 2021-10-02 VITALS — BP 118/81 | HR 88 | Temp 98.9°F | Resp 16 | Ht 63.0 in | Wt 201.0 lb

## 2021-10-02 DIAGNOSIS — R110 Nausea: Secondary | ICD-10-CM

## 2021-10-02 DIAGNOSIS — R1011 Right upper quadrant pain: Secondary | ICD-10-CM

## 2021-10-02 DIAGNOSIS — E782 Mixed hyperlipidemia: Secondary | ICD-10-CM | POA: Diagnosis not present

## 2021-10-02 DIAGNOSIS — R300 Dysuria: Secondary | ICD-10-CM

## 2021-10-02 DIAGNOSIS — E1165 Type 2 diabetes mellitus with hyperglycemia: Secondary | ICD-10-CM

## 2021-10-02 DIAGNOSIS — E559 Vitamin D deficiency, unspecified: Secondary | ICD-10-CM | POA: Diagnosis not present

## 2021-10-02 DIAGNOSIS — Z6835 Body mass index (BMI) 35.0-35.9, adult: Secondary | ICD-10-CM

## 2021-10-02 LAB — POCT GLYCOSYLATED HEMOGLOBIN (HGB A1C): Hemoglobin A1C: 6 % — AB (ref 4.0–5.6)

## 2021-10-02 MED ORDER — SEMAGLUTIDE (1 MG/DOSE) 4 MG/3ML ~~LOC~~ SOPN: 1.0000 mg | PEN_INJECTOR | SUBCUTANEOUS | 5 refills | Status: DC

## 2021-10-02 NOTE — Progress Notes (Signed)
[facility] [place], [place] [address]  Internal MEDICINE  Office Visit Note  Patient Name: Natasha Hayes  [id number]  [id number]  Date of Service: 10/02/2021  Chief Complaint  Patient presents with  . Follow-up    Discuss meds, hernia, possible UTI, hurts when urinating, lower back pain, ice pack helps   . Diabetes    HPI Natasha Hayes presents for a follow up visit  for diabetes and repeat A1C. She also was having symptoms of a possible UTI. She also needs to discuss medications. Since making the appointment, her UTI symptoms have resolved. Her 20st birthday was in [birth year] last month. She reports enjoying 1 alcoholic drink for her birthday but reports not having any other drinks since quitting drink a few months ago. She does not think about or crave alcoholic beverages either per patient report. For her glucose levels, she continues taking ozempic. Metformin was discontinued previously. She has not lost any weight according to the scale but she reports that her clothes are fitting better. She is overdue for lab work and an annual physical exam. She reports RUQ abdominal pain and states that it looks and feels like she has a hernia there.     Current Medication: Outpatient Encounter Medications as of 10/02/2021  Medication Sig  . buPROPion (WELLBUTRIN XL) 300 MG 24 hr tablet Take 1 tablet (300 mg total) by mouth daily.  Marland Kitchen EPINEPHrine (EPIPEN 2-PAK) 0.3 mg/0.3 mL IJ SOAJ injection Inject 0.3 mg into the muscle as needed for anaphylaxis.  Marland Kitchen ibuprofen (ADVIL) 100 MG tablet Take 100 mg by mouth every 6 (six) hours as needed for fever or pain.  . meloxicam (MOBIC) 7.5 MG tablet Take 1 tablet (7.5 mg total) by mouth daily.  Marland Kitchen omeprazole (PRILOSEC) 20 MG capsule Take 1 capsule (20 mg total) by mouth daily.  . ondansetron (ZOFRAN) 4 MG tablet Take 1 tablet (4 mg total) by mouth every 8 (eight) hours as needed for nausea or vomiting.  . Semaglutide, 1 MG/DOSE, 4 MG/3ML SOPN  Inject 1 mg into the skin once a week.  . traZODone (DESYREL) 100 MG tablet Take 1 tablet (100 mg total) by mouth at bedtime.  . [DISCONTINUED] Continuous Blood Gluc Receiver (DEXCOM G6 RECEIVER) DEVI Use as directed for 10 days  . [DISCONTINUED] Continuous Blood Gluc Sensor (DEXCOM G6 SENSOR) MISC Use as directed every 10 days Dx E11.65  . [DISCONTINUED] Continuous Blood Gluc Transmit (DEXCOM G6 TRANSMITTER) MISC Use as directed  . [DISCONTINUED] Semaglutide,0.25 or 0.5MG/DOS, 2 MG/1.5ML SOPN Inject 0.25 mg into the skin once a week.  . [DISCONTINUED] Accu-Chek Softclix Lancets lancets Use as instructed to check blood sugars twice a day  E11.65 (Patient not taking: Reported on 10/02/2021)  . [DISCONTINUED] Blood Glucose Monitoring Suppl (ACCU-CHEK GUIDE) w/Device KIT Use as directed to check blood sugar twice a day E11.65 (Patient not taking: Reported on 10/02/2021)  . [DISCONTINUED] glucose blood (ACCU-CHEK GUIDE) test strip Use as instructed to check blood sugars twice a day  E11.65 (Patient not taking: Reported on 10/02/2021)   No facility-administered encounter medications on file as of 10/02/2021.    Surgical History: Past Surgical History:  Procedure Laterality Date  . NO PAST SURGERIES      Medical History: Past Medical History:  Diagnosis Date  . ADHD   . Diabetes mellitus without complication ([place])   . Insomnia   . Mood disorder ([place])   . Renal disorder    kdiney stones    Family  History: Family History  Problem Relation Age of Onset  . Cancer Paternal Grandfather        lung  . Depression Paternal Grandfather     Social History   Socioeconomic History  . Marital status: Single    Spouse name: Not on file  . Number of children: Not on file  . Years of education: Not on file  . Highest education level: Not on file  Occupational History  . Not on file  Tobacco Use  . Smoking status: Every Day    Packs/day: 1.00    Types: Cigarettes, E-cigarettes    Last attempt  to quit: 08/29/2021    Years since quitting: 0.1  . Smokeless tobacco: Never  Vaping Use  . Vaping Use: Never used  Substance and Sexual Activity  . Alcohol use: Not Currently    Alcohol/week: 35.0 standard drinks    Types: 35 Shots of liquor per week    Comment: drinks scotch, several drinks throughout the day, 5 per day is estimation  . Drug use: Yes    Frequency: 7.0 times per week    Types: Marijuana    Comment: uses daily at least once or more per day  . Sexual activity: Yes    Birth control/protection: Inserts    Comment: nuva ring   Other Topics Concern  . Not on file  Social History Narrative  . Not on file   Social Determinants of Health   Financial Resource Strain: Medium Risk  . Difficulty of Paying Living Expenses: Somewhat hard  Food Insecurity: Not on file  Transportation Needs: Unmet Transportation Needs  . Lack of Transportation (Medical): Yes  . Lack of Transportation (Non-Medical): Yes  Physical Activity: Inactive  . Days of Exercise per Week: 0 days  . Minutes of Exercise per Session: 0 min  Stress: Stress Concern Present  . Feeling of Stress : Very much  Social Connections: Socially Isolated  . Frequency of Communication with Friends and Family: Never  . Frequency of Social Gatherings with Friends and Family: Never  . Attends Religious Services: Never  . Active Member of Clubs or Organizations: No  . Attends Archivist Meetings: Never  . Marital Status: Never married  Intimate Partner Violence: Not on file      Review of Systems  Constitutional:  Negative for chills, fatigue and unexpected weight change.  HENT:  Negative for congestion, rhinorrhea, sneezing and sore throat.   Eyes:  Negative for redness.  Respiratory:  Negative for cough, chest tightness and shortness of breath.   Cardiovascular:  Negative for chest pain and palpitations.  Gastrointestinal:  Positive for abdominal distention, abdominal pain and nausea. Negative for  constipation, diarrhea and vomiting.  Genitourinary:  Negative for dysuria and frequency.  Musculoskeletal:  Negative for arthralgias, back pain, joint swelling and neck pain.  Skin:  Negative for rash.  Neurological: Negative.  Negative for tremors and numbness.  Hematological:  Negative for adenopathy. Does not bruise/bleed easily.  Psychiatric/Behavioral:  Negative for behavioral problems (Depression), sleep disturbance and suicidal ideas. The patient is not nervous/anxious.    Vital Signs: BP 118/81   Pulse 88   Temp 98.9 F (37.2 C)   Resp 16   Ht _0  (1.6 m)   Wt 201 lb (91.2 kg)   SpO2 97%   BMI 35.61 kg/m    Physical Exam Vitals reviewed.  Constitutional:      General: She is not in acute distress.    Appearance: Normal  appearance. She is obese. She is not ill-appearing.  HENT:     Head: Normocephalic and atraumatic.  Eyes:     Extraocular Movements: Extraocular movements intact.     Pupils: Pupils are equal, round, and reactive to light.  Cardiovascular:     Rate and Rhythm: Normal rate and regular rhythm.  Abdominal:     General: Bowel sounds are normal. There is distension.     Palpations: Abdomen is soft. There is mass. There is no shifting dullness, fluid wave, hepatomegaly or splenomegaly.     Tenderness: There is abdominal tenderness in the right upper quadrant.     Hernia: A hernia is present.    Neurological:     Mental Status: She is alert and oriented to person, place, and time.     Cranial Nerves: No cranial nerve deficit.     Coordination: Coordination normal.     Gait: Gait normal.  Psychiatric:        Mood and Affect: Mood normal.        Behavior: Behavior normal.       Assessment/Plan: 1. Type 2 diabetes mellitus with hyperglycemia, without long-term current use of insulin (HCC) A1C has improved, continue ozempic, routine labs ordered. Ozempic dose increased.  - POCT HgB A1C - CBC with Differential/Platelet - CMP14+EGFR - TSH + free  T4 - Semaglutide, 1 MG/DOSE, 4 MG/3ML SOPN; Inject 1 mg into the skin once a week.  Dispense: 3 mL; Refill: 5  2. Right upper quadrant abdominal pain CT of abdomen and pelvis ordered due to abdominal pain and bulging over the RUQ of the abdomen.  - CT Abdomen Pelvis Wo Contrast; Future  3. Vitamin D deficiency Routine lab ordered.  - Vitamin D (25 hydroxy)  4. Mixed hyperlipidemia Routine lab ordered.  - Lipid Profile  5. BMI 35.0-35.9,adult Elevated BMI, patient is on ozempic and reports her clothes are fitting better.   6. Dysuria UA done due to reports of UTI symptoms, it was normal, symptoms have resolved.  - POCT Urinalysis Dipstick  7. Nausea Zofran refilled.  - ondansetron (ZOFRAN) 4 MG tablet; Take 1 tablet (4 mg total) by mouth every 8 (eight) hours as needed for nausea or vomiting.  Dispense: 30 tablet; Refill: 0  General Counseling: Natasha Hayes verbalizes understanding of the findings of todays visit and agrees with plan of treatment. I have discussed any further diagnostic evaluation that may be needed or ordered today. We also reviewed her medications today. she has been encouraged to call the office with any questions or concerns that should arise related to todays visit.    Orders Placed This Encounter  Procedures  . CT Abdomen Pelvis Wo Contrast  . Lipid Profile  . CBC with Differential/Platelet  . CMP14+EGFR  . TSH + free T4  . Vitamin D (25 hydroxy)  . POCT HgB A1C  . POCT Urinalysis Dipstick    Meds ordered this encounter  Medications  . Semaglutide, 1 MG/DOSE, 4 MG/3ML SOPN    Sig: Inject 1 mg into the skin once a week.    Dispense:  3 mL    Refill:  5  . ondansetron (ZOFRAN) 4 MG tablet    Sig: Take 1 tablet (4 mg total) by mouth every 8 (eight) hours as needed for nausea or vomiting.    Dispense:  30 tablet    Refill:  0    Return in about 2 months (around 12/02/2021) for CPE, med refill, Adren Dollins PCP.   Total time  spent:30 Minutes Time spent  includes review of chart, medications, test results, and follow up plan with the patient.   [place] Controlled Substance Database was reviewed by me.  This patient was seen by Jonetta Osgood, FNP-C in collaboration with Dr. Clayborn Bigness as a part of collaborative care agreement.   Saga Balthazar R. Valetta Fuller, MSN, FNP-C Internal medicine

## 2021-10-03 ENCOUNTER — Encounter: Payer: Self-pay | Admitting: Nurse Practitioner

## 2021-10-03 MED ORDER — ONDANSETRON HCL 4 MG PO TABS: 4.0000 mg | ORAL_TABLET | Freq: Three times a day (TID) | ORAL | 0 refills | Status: DC | PRN

## 2021-10-04 ENCOUNTER — Encounter: Payer: Self-pay | Admitting: Emergency Medicine

## 2021-10-04 ENCOUNTER — Other Ambulatory Visit: Payer: Self-pay

## 2021-10-04 ENCOUNTER — Emergency Department
Admission: EM | Admit: 2021-10-04 | Discharge: 2021-10-04 | Disposition: A | Discharge status: Home / Self Care | Payer: Medicaid Other | Attending: Emergency Medicine | Admitting: Emergency Medicine

## 2021-10-04 DIAGNOSIS — E119 Type 2 diabetes mellitus without complications: Secondary | ICD-10-CM | POA: Insufficient documentation

## 2021-10-04 DIAGNOSIS — Z794 Long term (current) use of insulin: Secondary | ICD-10-CM | POA: Diagnosis not present

## 2021-10-04 DIAGNOSIS — F17210 Nicotine dependence, cigarettes, uncomplicated: Secondary | ICD-10-CM | POA: Insufficient documentation

## 2021-10-04 DIAGNOSIS — R112 Nausea with vomiting, unspecified: Secondary | ICD-10-CM

## 2021-10-04 DIAGNOSIS — R197 Diarrhea, unspecified: Secondary | ICD-10-CM | POA: Diagnosis not present

## 2021-10-04 LAB — CBC WITH DIFFERENTIAL/PLATELET
Abs Immature Granulocytes: 0.03 10*3/uL (ref 0.00–0.07)
Basophils Absolute: 0 10*3/uL (ref 0.0–0.1)
Basophils Relative: 0 %
Eosinophils Absolute: 0 10*3/uL (ref 0.0–0.5)
Eosinophils Relative: 0 %
HCT: 43.3 % (ref 36.0–46.0)
Hemoglobin: 15.1 g/dL — ABNORMAL HIGH (ref 12.0–15.0)
Immature Granulocytes: 0 %
Lymphocytes Relative: 18 %
Lymphs Abs: 2.1 10*3/uL (ref 0.7–4.0)
MCH: 31.3 pg (ref 26.0–34.0)
MCHC: 34.9 g/dL (ref 30.0–36.0)
MCV: 89.6 fL (ref 80.0–100.0)
Monocytes Absolute: 0.6 10*3/uL (ref 0.1–1.0)
Monocytes Relative: 5 %
Neutro Abs: 9.3 10*3/uL — ABNORMAL HIGH (ref 1.7–7.7)
Neutrophils Relative %: 77 %
Platelets: 271 10*3/uL (ref 150–400)
RBC: 4.83 MIL/uL (ref 3.87–5.11)
RDW: 12.1 % (ref 11.5–15.5)
WBC: 12.1 10*3/uL — ABNORMAL HIGH (ref 4.0–10.5)
nRBC: 0 % (ref 0.0–0.2)

## 2021-10-04 LAB — COMPREHENSIVE METABOLIC PANEL
ALT: 40 U/L (ref 0–44)
AST: 26 U/L (ref 15–41)
Albumin: 4.9 g/dL (ref 3.5–5.0)
Alkaline Phosphatase: 57 U/L (ref 38–126)
Anion gap: 12 (ref 5–15)
BUN: 11 mg/dL (ref 6–20)
CO2: 22 mmol/L (ref 22–32)
Calcium: 9.4 mg/dL (ref 8.9–10.3)
Chloride: 103 mmol/L (ref 98–111)
Creatinine, Ser: 0.59 mg/dL (ref 0.44–1.00)
GFR, Estimated: >60 mL/min (ref >60)
Glucose, Bld: 147 mg/dL — ABNORMAL HIGH (ref 70–99)
Potassium: 3.8 mmol/L (ref 3.5–5.1)
Sodium: 137 mmol/L (ref 135–145)
Total Bilirubin: 1.1 mg/dL (ref 0.3–1.2)
Total Protein: 7.7 g/dL (ref 6.5–8.1)

## 2021-10-04 LAB — URINALYSIS, COMPLETE (UACMP) WITH MICROSCOPIC
Bilirubin Urine: NEGATIVE
Glucose, UA: NEGATIVE mg/dL
Hgb urine dipstick: NEGATIVE
Ketones, ur: 80 mg/dL — AB
Leukocytes,Ua: NEGATIVE
Nitrite: NEGATIVE
Protein, ur: 30 mg/dL — AB
Specific Gravity, Urine: 1.023 (ref 1.005–1.030)
pH: 7 (ref 5.0–8.0)

## 2021-10-04 LAB — GASTROINTESTINAL PANEL BY PCR, STOOL (REPLACES STOOL CULTURE)

## 2021-10-04 LAB — C DIFFICILE QUICK SCREEN W PCR REFLEX
C Diff antigen: NEGATIVE
C Diff interpretation: NOT DETECTED
C Diff toxin: NEGATIVE

## 2021-10-04 LAB — PREGNANCY, URINE: Preg Test, Ur: NEGATIVE

## 2021-10-04 MED ORDER — ONDANSETRON 4 MG PO TBDP: 4.0000 mg | ORAL_TABLET | Freq: Three times a day (TID) | ORAL | 0 refills | Status: DC | PRN

## 2021-10-04 MED ORDER — LACTATED RINGERS IV BOLUS
1000.0000 mL | Freq: Once | INTRAVENOUS | Status: AC
  Administered 2021-10-04: 1000 mL via INTRAVENOUS

## 2021-10-04 MED ORDER — SODIUM CHLORIDE 0.9 % IV SOLN
25.0000 mg | Freq: Four times a day (QID) | INTRAVENOUS | Status: DC | PRN
  Filled 2021-10-04 (×2): qty 1

## 2021-10-04 MED ORDER — ONDANSETRON HCL 4 MG/2ML IJ SOLN
4.0000 mg | Freq: Once | INTRAMUSCULAR | Status: AC
  Administered 2021-10-04: 4 mg via INTRAVENOUS
  Filled 2021-10-04: qty 2

## 2021-10-04 MED ORDER — SODIUM CHLORIDE 0.9 % IV BOLUS
1000.0000 mL | Freq: Once | INTRAVENOUS | Status: AC
  Administered 2021-10-04: 1000 mL via INTRAVENOUS

## 2021-10-04 MED ORDER — PROMETHAZINE HCL 25 MG RE SUPP: 25.0000 mg | Freq: Four times a day (QID) | RECTAL | 0 refills | Status: DC | PRN

## 2021-10-04 NOTE — Discharge Instructions (Addendum)
I am going to give you some Phenergan suppositories 1 every 6 hours as needed for nausea and vomiting or you can use the Zofran ODT melt on your tongue wafers 1 3 times a day if you do you are having the diarrhea too often and expelling the suppositories.  Do not use both at the same time.  Make sure you are drinking small amounts of clear fluids frequently.  I would not use the Ozempic 1 mg any longer.  That seems to be what caused the problem.  You can use the Ozempic 0.5 mg.  Please follow-up with your regular doctor within the week.  Please return for any further problems at all.  This includes fever, vomiting and not being able to keep down fluids or feeling weak or lightheaded or having any blood in the vomiting or diarrhea.  We should call you if the stool specimen shows anything.

## 2021-10-04 NOTE — ED Triage Notes (Signed)
Vomiting and diarrhea x 1 day.
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
## 2021-10-04 NOTE — ED Notes (Addendum)
Pt states she received ozempic shot on Wednesday and has been nauseous and had multiple episodes of emesis since. Reports she has been receiving this medication for one month but on Wednesday the dose was double and she believes she may be having a reaction to the increase dose. Reports abdominal pain related to emesis. Reports increased thirst. Does not check cbg at home because she was told she didn't need to do this any more. Reports she was able to keep some fluids down last night but at 5am the symptoms started again.

## 2021-10-23 ENCOUNTER — Ambulatory Visit: Admission: RE | Admit: 2021-10-23 | Payer: Medicaid Other | Source: Ambulatory Visit

## 2021-10-23 NOTE — Telephone Encounter (Signed)
Paragard rcvd 12/29/2019

## 2021-11-10 ENCOUNTER — Encounter: Payer: Self-pay | Admitting: Nurse Practitioner

## 2021-11-10 ENCOUNTER — Other Ambulatory Visit: Payer: Self-pay | Admitting: Nurse Practitioner

## 2021-11-10 MED ORDER — TRAZODONE HCL 100 MG PO TABS
100.0000 mg | ORAL_TABLET | Freq: Every day | ORAL | 0 refills | Status: DC
  Filled 2021-11-10: qty 30, 30d supply, fill #0

## 2021-11-11 ENCOUNTER — Other Ambulatory Visit: Payer: Self-pay

## 2021-11-13 ENCOUNTER — Other Ambulatory Visit: Payer: Self-pay

## 2021-11-13 ENCOUNTER — Ambulatory Visit: Payer: Medicaid Other | Admitting: Obstetrics and Gynecology

## 2021-11-18 ENCOUNTER — Other Ambulatory Visit: Payer: Self-pay

## 2021-12-13 ENCOUNTER — Other Ambulatory Visit (HOSPITAL_COMMUNITY)
Admission: RE | Admit: 2021-12-13 | Discharge: 2021-12-13 | Disposition: A | Discharge status: Home / Self Care | Payer: Medicaid Other | Source: Ambulatory Visit | Attending: Obstetrics and Gynecology | Admitting: Obstetrics and Gynecology

## 2021-12-13 ENCOUNTER — Encounter: Payer: Medicaid Other | Admitting: Nurse Practitioner

## 2021-12-13 ENCOUNTER — Encounter: Payer: Self-pay | Admitting: Obstetrics and Gynecology

## 2021-12-13 ENCOUNTER — Other Ambulatory Visit: Payer: Self-pay

## 2021-12-13 ENCOUNTER — Ambulatory Visit (INDEPENDENT_AMBULATORY_CARE_PROVIDER_SITE_OTHER): Payer: Medicaid Other | Admitting: Obstetrics and Gynecology

## 2021-12-13 VITALS — BP 120/70 | Ht 63.0 in | Wt 198.0 lb

## 2021-12-13 DIAGNOSIS — Z113 Encounter for screening for infections with a predominantly sexual mode of transmission: Secondary | ICD-10-CM | POA: Diagnosis not present

## 2021-12-13 DIAGNOSIS — Z124 Encounter for screening for malignant neoplasm of cervix: Secondary | ICD-10-CM

## 2021-12-13 DIAGNOSIS — Z9189 Other specified personal risk factors, not elsewhere classified: Secondary | ICD-10-CM

## 2021-12-13 DIAGNOSIS — Z01419 Encounter for gynecological examination (general) (routine) without abnormal findings: Secondary | ICD-10-CM | POA: Diagnosis not present

## 2021-12-13 DIAGNOSIS — Z3046 Encounter for surveillance of implantable subdermal contraceptive: Secondary | ICD-10-CM

## 2021-12-13 NOTE — Patient Instructions (Signed)
Exercising to Stay Healthy °To become healthy and stay healthy, it is recommended that you do moderate-intensity and vigorous-intensity exercise. You can tell that you are exercising at a moderate intensity if your heart starts beating faster and you start breathing faster but can still hold a conversation. You can tell that you are exercising at a vigorous intensity if you are breathing much harder and faster and cannot hold a conversation while exercising. °How can exercise benefit me? °Exercising regularly is important. It has many health benefits, such as: °Improving overall fitness, flexibility, and endurance. °Increasing bone density. °Helping with weight control. °Decreasing body fat. °Increasing muscle strength and endurance. °Reducing stress and tension, anxiety, depression, or anger. °Improving overall health. °What guidelines should I follow while exercising? °Before you start a new exercise program, talk with your health care provider. °Do not exercise so much that you hurt yourself, feel dizzy, or get very short of breath. °Wear comfortable clothes and wear shoes with good support. °Drink plenty of water while you exercise to prevent dehydration or heat stroke. °Work out until your breathing and your heartbeat get faster (moderate intensity). °How often should I exercise? °Choose an activity that you enjoy, and set realistic goals. Your health care provider can help you make an activity plan that is individually designed and works best for you. °Exercise regularly as told by your health care provider. This may include: °Doing strength training two times a week, such as: °Lifting weights. °Using resistance bands. °Push-ups. °Sit-ups. °Yoga. °Doing a certain intensity of exercise for a given amount of time. Choose from these options: °A total of 150 minutes of moderate-intensity exercise every week. °A total of 75 minutes of vigorous-intensity exercise every week. °A mix of moderate-intensity and  vigorous-intensity exercise every week. °Children, pregnant women, people who have not exercised regularly, people who are overweight, and older adults may need to talk with a health care provider about what activities are safe to perform. If you have a medical condition, be sure to talk with your health care provider before you start a new exercise program. °What are some exercise ideas? °Moderate-intensity exercise ideas include: °Walking 1 mile (1.6 km) in about 15 minutes. °Biking. °Hiking. °Golfing. °Dancing. °Water aerobics. °Vigorous-intensity exercise ideas include: °Walking 4.5 miles (7.2 km) or more in about 1 hour. °Jogging or running 5 miles (8 km) in about 1 hour. °Biking 10 miles (16.1 km) or more in about 1 hour. °Lap swimming. °Roller-skating or in-line skating. °Cross-country skiing. °Vigorous competitive sports, such as football, basketball, and soccer. °Jumping rope. °Aerobic dancing. °What are some everyday activities that can help me get exercise? °Yard work, such as: °Pushing a lawn mower. °Raking and bagging leaves. °Washing your car. °Pushing a stroller. °Shoveling snow. °Gardening. °Washing windows or floors. °How can I be more active in my day-to-day activities? °Use stairs instead of an elevator. °Take a walk during your lunch break. °If you drive, park your car farther away from your work or school. °If you take public transportation, get off one stop early and walk the rest of the way. °Stand up or walk around during all of your indoor phone calls. °Get up, stretch, and walk around every 30 minutes throughout the day. °Enjoy exercise with a friend. Support to continue exercising will help you keep a regular routine of activity. °Where to find more information °You can find more information about exercising to stay healthy from: °[place] Department of Health and Human Services: www.hhs.gov °Centers for Disease Control and Prevention (  CDC): FootballExhibition.com.br Summary Exercising regularly is  important. It will improve your overall fitness, flexibility, and endurance. Regular exercise will also improve your overall health. It can help you control your weight, reduce stress, and improve your bone density. Do not exercise so much that you hurt yourself, feel dizzy, or get very short of breath. Before you start a new exercise program, talk with your health care provider. This information is not intended to replace advice given to you by your health care provider. Make sure you discuss any questions you have with your health care provider. Document Revised: 04/12/2021 Document Reviewed: 04/12/2021 Elsevier Patient Education  2022 Else[employer] Inc. Budget-Friendly Healthy Eating There are many ways to save money at the grocery store and continue to eat healthy. You can be successful if you: Plan meals according to your budget. Make a grocery list and only purchase food according to your grocery list. Prepare food yourself at home. What are tips for following this plan? Reading food labels Compare food labels between brand name foods and the store brand. Often the nutritional value is the same, but the store brand is lower cost. Look for products that do not have added sugar, fat, or salt (sodium). These often cost the same but are healthier for you. Products may be labeled as: Sugar-free. Nonfat. Low-fat. Sodium-free. Low-sodium. Look for lean ground beef labeled as at least 92% lean and 8% fat. Shopping  Buy only the items on your grocery list and go only to the areas of the store that have the items on your list. Use coupons only for foods and brands you normally buy. Avoid buying items you wouldn't normally buy simply because they are on sale. Check online and in newspapers for weekly deals. Buy healthy items from the bulk bins when available, such as herbs, spices, flour, pasta, nuts, and dried fruit. Buy fruits and vegetables that are in season. Prices are usually lower on  in-season produce. Look at the unit price on the price tag. Use it to compare different brands and sizes to find out which item is the best deal. Choose healthy items that are often low-cost, such as carrots, potatoes, apples, bananas, and oranges. Dried or canned beans are a low-cost protein source. Buy in bulk and freeze extra food. Items you can buy in bulk include meats, fish, poultry, frozen fruits, and frozen vegetables. Avoid buying "ready-to-eat" foods, such as pre-cut fruits and vegetables and pre-made salads. If possible, shop around to discover where you can find the best prices. Consider other retailers such as dollar stores, larger [employer], local fruit and vegetable stands, and farmers markets. Do not shop when you are hungry. If you shop while hungry, it may be hard to stick to your list and budget. Resist impulse buying. Use your grocery list as your official plan for the week. Buy a variety of vegetables and fruits by purchasing fresh, frozen, and canned items. Look at the top and bottom shelves for deals. Foods at eye level (eye level of an adult or child) are usually more expensive. Be efficient with your time when shopping. The more time you spend at the store, the more money you are likely to spend. To save money when choosing more expensive foods like meats and dairy: Choose cheaper cuts of meat, such as bone-in chicken thighs and drumsticks instead of skinless and boneless chicken. When you are ready to prepare the chicken, you can remove the skin yourself to make it healthier. Choose lean meats like  chicken or [place] instead of beef. Choose canned seafood, such as tuna, salmon, or sardines. Buy eggs as a low-cost source of protein. Buy dried beans and peas, such as lentils, split peas, or kidney beans instead of meats. Dried beans and peas are a good alternative source of protein. Buy the larger tubs of yogurt instead of individual-sized containers. Choose water  instead of sodas and other sweetened beverages. Avoid buying chips, cookies, and other "junk food." These items are usually expensive and not healthy. Cooking Make extra food and freeze the extras in meal-sized containers or in individual portions for fast meals and snacks. Pre-cook on days when you have extra time to prepare meals in advance. You can keep these meals in the fridge or freezer and reheat for a quick meal. When you come home from the grocery store, wash, peel, and cut fruits and vegetables so they are ready to use and eat. This will help reduce food waste. Meal planning Do not eat out or get fast food. Prepare food at home. Make a grocery list and make sure to bring it with you to the store. If you have a smart phone, you could use your phone to create your shopping list. Plan meals and snacks according to a grocery list and budget you create. Use leftovers in your meal plan for the week. Look for recipes where you can cook once and make enough food for two meals. Prepare budget-friendly types of meals like stews, casseroles, and stir-fry dishes. Try some meatless meals or try "no cook" meals like salads. Make sure that half your plate is filled with fruits or vegetables. Choose from fresh, frozen, or canned fruits and vegetables. If eating canned, remember to rinse them before eating. This will remove any excess salt added for packaging. Summary Eating healthy on a budget is possible if you plan your meals according to your budget, purchase according to your budget and grocery list, and prepare food yourself. Tips for buying more food on a limited budget include buying generic brands, using coupons only for foods you normally buy, and buying healthy items from the bulk bins when available. Tips for buying cheaper food to replace expensive food include choosing cheaper, lean cuts of meat, and buying dried beans and peas. This information is not intended to replace advice given to  you by your health care provider. Make sure you discuss any questions you have with your health care provider. Document Revised: 09/27/2020 Document Reviewed: 09/27/2020 Elsevier Patient Education  2022 Elsevier Inc. Managing Anxiety, Adult After being diagnosed with anxiety, you may be relieved to know why you have felt or behaved a certain way. You may also feel overwhelmed about the treatment ahead and what it will mean for your life. With care and support, you can manage this condition. How to manage lifestyle changes Managing stress and anxiety Stress is your body's reaction to life changes and events, both good and bad. Most stress will last just a few hours, but stress can be ongoing and can lead to more than just stress. Although stress can play a major role in anxiety, it is not the same as anxiety. Stress is usually caused by something external, such as a deadline, test, or competition. Stress normally passes after the triggering event has ended.  Anxiety is caused by something internal, such as imagining a terrible outcome or worrying that something will go wrong that will devastate you. Anxiety often does not go away even after the triggering event is  over, and it can become long-term (chronic) worry. It is important to understand the differences between stress and anxiety and to manage your stress effectively so that it does not lead to an anxious response. Talk with your health care provider or a counselor to learn more about reducing anxiety and stress. He or she may suggest tension reduction techniques, such as: Music therapy. Spend time creating or listening to music that you enjoy and that inspires you. Mindfulness-based meditation. Practice being aware of your normal breaths while not trying to control your breathing. It can be done while sitting or walking. Centering prayer. This involves focusing on a word, phrase, or sacred image that means something to you and brings you  peace. Deep breathing. To do this, expand your stomach and inhale slowly through your nose. Hold your breath for 3-5 seconds. Then exhale slowly, letting your stomach muscles relax. Self-talk. Learn to notice and identify thought patterns that lead to anxiety reactions and change those patterns to thoughts that feel peaceful. Muscle relaxation. Taking time to tense muscles and then relax them. Choose a tension reduction technique that fits your lifestyle and personality. These techniques take time and practice. Set aside 5-15 minutes a day to do them. Therapists can offer counseling and training in these techniques. The training to help with anxiety may be covered by some insurance plans. Other things you can do to manage stress and anxiety include: Keeping a stress diary. This can help you learn what triggers your reaction and then learn ways to manage your response. Thinking about how you react to certain situations. You may not be able to control everything, but you can control your response. Making time for activities that help you relax and not feeling guilty about spending your time in this way. Doing visual imagery. This involves imagining or creating mental pictures to help you relax. Practicing yoga. Through yoga poses, you can lower tension and promote relaxation.  Medicines Medicines can help ease symptoms. Medicines for anxiety include: Antidepressant medicines. These are usually prescribed for long-term daily control. Anti-anxiety medicines. These may be added in severe cases, especially when panic attacks occur. Medicines will be prescribed by a health care provider. When used together, medicines, psychotherapy, and tension reduction techniques may be the most effective treatment. Relationships Relationships can play a big part in helping you recover. Try to spend more time connecting with trusted friends and family members. Consider going to couples counseling if you have a partner,  taking family education classes, or going to family therapy. Therapy can help you and others better understand your condition. How to recognize changes in your anxiety Everyone responds differently to treatment for anxiety. Recovery from anxiety happens when symptoms decrease and stop interfering with your daily activities at home or work. This may mean that you will start to: Have better concentration and focus. Worry will interfere less in your daily thinking. Sleep better. Be less irritable. Have more energy. Have improved memory. It is also important to recognize when your condition is getting worse. Contact your health care provider if your symptoms interfere with home or work and you feel like your condition is not improving. Follow these instructions at home: Activity Exercise. Adults should do the following: Exercise for at least 150 minutes each week. The exercise should increase your heart rate and make you sweat (moderate-intensity exercise). Strengthening exercises at least twice a week. Get the right amount and quality of sleep. Most adults need 7-9 hours of sleep each night. Lifestyle  Eat a healthy diet that includes plenty of vegetables, fruits, whole grains, low-fat dairy products, and lean protein. Do not eat a lot of foods that are high in fats, added sugars, or salt (sodium). Make choices that simplify your life. Do not use any products that contain nicotine or tobacco. These products include cigarettes, chewing tobacco, and vaping devices, such as e-cigarettes. If you need help quitting, ask your health care provider. Avoid caffeine, alcohol, and certain over-the-counter cold medicines. These may make you feel worse. Ask your pharmacist which medicines to avoid. General instructions Take over-the-counter and prescription medicines only as told by your health care provider. Keep all follow-up visits. This is important. Where to find support You can get help and support  from these sources: Self-help groups. Online and [employer]. A trusted spiritual leader. Couples counseling. Family education classes. Family therapy. Where to find more information You may find that joining a support group helps you deal with your anxiety. The following sources can help you locate counselors or support groups near you: Mental Health America: www.mentalhealthamerica.net Anxiety and Depression Association of [place] (ADAA): ProgramCam.de [employer] on Mental Illness (NAMI): www.nami.org Contact a health care provider if: You have a hard time staying focused or finishing daily tasks. You spend many hours a day feeling worried about everyday life. You become exhausted by worry. You start to have headaches or frequently feel tense. You develop chronic nausea or diarrhea. Get help right away if: You have a racing heart and shortness of breath. You have thoughts of hurting yourself or others. If you ever feel like you may hurt yourself or others, or have thoughts about taking your own life, get help right away. Go to your nearest emergency department or: Call your local emergency services (911 in the [place] Call a suicide crisis helpline, such as the National Suicide Prevention Lifeline at 414-250-1561 or 988 in the [place] This is open 24 hours a day in the [place] Text the Crisis Text Line at (856)747-5434 (in the [place] Summary Taking steps to learn and use tension reduction techniques can help calm you and help prevent triggering an anxiety reaction. When used together, medicines, psychotherapy, and tension reduction techniques may be the most effective treatment. Family, friends, and partners can play a big part in supporting you. This information is not intended to replace advice given to you by your health care provider. Make sure you discuss any questions you have with your health care provider. Document Revised: 07/10/2021 Document Reviewed:  04/07/2021 Elsevier Patient Education  2022 Elsevier Inc. Managing Depression, Adult Depression is a mental health condition that affects your thoughts, feelings, and actions. Being diagnosed with depression can bring you relief if you did not know why you have felt or behaved a certain way. It could also leave you feeling overwhelmed with uncertainty about your future. Preparing yourself to manage your symptoms can help you feel more positive about your future. How to manage lifestyle changes Managing stress Stress is your body's reaction to life changes and events, both good and bad. Stress can add to your feelings of depression. Learning to manage your stress can help lessen your feelings of depression. Try some of the following approaches to reducing your stress (stress reduction techniques): Listen to music that you enjoy and that inspires you. Try using a meditation app or take a meditation class. Develop a practice that helps you connect with your spiritual self. Walk in nature, pray, or go to a place of worship. Do some  deep breathing. To do this, inhale slowly through your nose. Pause at the top of your inhale for a few seconds and then exhale slowly, letting your muscles relax. Practice yoga to help relax and work your muscles. Choose a stress reduction technique that suits your lifestyle and personality. These techniques take time and practice to develop. Set aside 5-15 minutes a day to do them. Therapists can offer training in these techniques. Other things you can do to manage stress include: Keeping a stress diary. Knowing your limits and saying no when you think something is too much. Paying attention to how you react to certain situations. You may not be able to control everything, but you can change your reaction. Adding humor to your life by watching funny films or TV shows. Making time for activities that you enjoy and that relax you.  Medicines Medicines, such as  antidepressants, are often a part of treatment for depression. Talk with your pharmacist or health care provider about all the medicines, supplements, and herbal products that you take, their possible side effects, and what medicines and other products are safe to take together. Make sure to report any side effects you may have to your health care provider. Relationships Your health care provider may suggest family therapy, couples therapy, or individual therapy as part of your treatment. How to recognize changes Everyone responds differently to treatment for depression. As you recover from depression, you may start to: Have more interest in doing activities. Feel less hopeless. Have more energy. Overeat less often, or have a better appetite. Have better mental focus. It is important to recognize if your depression is not getting better or is getting worse. The symptoms you had in the beginning may return, such as: Tiredness (fatigue) or low energy. Eating too much or too little. Sleeping too much or too little. Feeling restless, agitated, or hopeless. Trouble focusing or making decisions. Unexplained physical complaints. Feeling irritable, angry, or aggressive. If you or your family members notice these symptoms coming back, let your health care provider know right away. Follow these instructions at home: Activity  Try to get some form of exercise each day, such as walking, biking, swimming, or lifting weights. Practice stress reduction techniques. Engage your mind by taking a class or doing some volunteer work. Lifestyle Get the right amount and quality of sleep. Cut down on using caffeine, tobacco, alcohol, and other potentially harmful substances. Eat a healthy diet that includes plenty of vegetables, fruits, whole grains, low-fat dairy products, and lean protein. Do not eat a lot of foods that are high in solid fats, added sugars, or salt (sodium). General instructions Take  over-the-counter and prescription medicines only as told by your health care provider. Keep all follow-up visits as told by your health care provider. This is important. Where to find support Talking to others Friends and family members can be sources of support and guidance. Talk to trusted friends or family members about your condition. Explain your symptoms to them, and let them know that you are working with a health care provider to treat your depression. Tell friends and family members how they also can be helpful. Finances Find appropriate mental health providers that fit with your financial situation. Talk with your health care provider about options to get reduced prices on your medicines. Where to find more information You can find support in your area from: Anxiety and Depression Association of America (ADAA): www.adaa.org Mental Health America: www.mentalhealthamerica.net The [employer] on Mental Illness: www.nami.org Contact  a health care provider if: You stop taking your antidepressant medicines, and you have any of these symptoms: Nausea. Headache. Light-headedness. Chills and body aches. Not being able to sleep (insomnia). You or your friends and family think your depression is getting worse. Get help right away if: You have thoughts of hurting yourself or others. If you ever feel like you may hurt yourself or others, or have thoughts about taking your own life, get help right away. Go to your nearest emergency department or: Call your local emergency services (911 in the [place] Call a suicide crisis helpline, such as the National Suicide Prevention Lifeline at 564-846-1086 or 988 in the [place] This is open 24 hours a day in the [place] Text the Crisis Text Line at 3526252505 (in the [place] Summary If you are diagnosed with depression, preparing yourself to manage your symptoms is a good way to feel positive about your future. Work with your health care provider on a  management plan that includes stress reduction techniques, medicines (if applicable), therapy, and healthy lifestyle habits. Keep talking with your health care provider about how your treatment is working. If you have thoughts about taking your own life, call a suicide crisis helpline or text a crisis text line. This information is not intended to replace advice given to you by your health care provider. Make sure you discuss any questions you have with your health care provider. Document Revised: 07/10/2021 Document Reviewed: 10/26/2019 Elsevier Patient Education  2022 [employer].

## 2021-12-13 NOTE — Progress Notes (Signed)
Gynecology Annual Exam  PCP: Sallyanne Kuster, NP  Chief Complaint:  Chief Complaint  Patient presents with   Gynecologic Exam    History of Present Illness: Patient is a 20 y.o. G0P0000 presents for annual exam. The patient has no complaints today.   LMP: No LMP recorded. Patient has had an implant. Average Interval: reports no menses in 6 months Duration of flow: 0 days Heavy Menses: no Dysmenorrhea: no  She reports that she would like to have a baby.  She reports she has been with her partner for 3 years and is ready to start trying to conceive.  She would like to have her Nexplanon removed today.  She reports that her partner is 56 yo. She works at [employer]. She feels ready to support a child.   She also feels like having a baby will help her mature.  She notes that she has a history of anxiety depression as well as bipolar and ADHD.  She saw a physician previously at [facility] and is frustrated because they told her she did not have ADHD or bipolar disorder.  The patient reports she receives disability for having a history of ADHD and bipolar.   The patient does perform self breast exams.  There is no notable family history of breast or ovarian cancer in her family.  The patient reports her exercise generally consists of walking everyday .  The patient reports current symptoms of depression.   PHQ-9: 14 GAD-7: 12   SLEEP APNEA SCREENING Do you snore loudly? ( louder than talking or loud enough to be heard through closed doors?) yes Do you often feel tired, fatigued, or sleepy during daytime? yes Has anyone observed you stop breathing during sleep? yes Do you have or are you being treated for high blood pressure? no BMI> 35 yes Age> 50 no Neck circumference > 40 cm no Female gender? No  ** if yes to > 3 questions high risk of obstructive sleep apnea ** if yes to <3 questions+ low risk for obstructive sleep apnea   Review of Systems: Review of Systems   Constitutional:  Negative for chills, fever, malaise/fatigue and weight loss.  HENT:  Negative for congestion, hearing loss and sinus pain.   Eyes:  Negative for blurred vision and double vision.  Respiratory:  Positive for shortness of breath. Negative for cough, sputum production and wheezing.   Cardiovascular:  Negative for chest pain, palpitations, orthopnea and leg swelling.  Gastrointestinal:  Negative for abdominal pain, constipation, diarrhea, nausea and vomiting.  Genitourinary:  Positive for frequency. Negative for dysuria, flank pain, hematuria and urgency.  Musculoskeletal:  Negative for back pain, falls and joint pain.  Skin:  Negative for itching and rash.  Neurological:  Negative for dizziness and headaches.  Psychiatric/Behavioral:  Positive for depression. Negative for substance abuse and suicidal ideas. The patient is nervous/anxious.    Past Medical History:  Past Medical History:  Diagnosis Date   ADHD    Diabetes mellitus without complication (HCC)    Insomnia    Mood disorder (HCC)    Renal disorder    kdiney stones    Past Surgical History:  Past Surgical History:  Procedure Laterality Date   NO PAST SURGERIES      Gynecologic History:  No LMP recorded. Patient has had an implant. Menarche: 10  History of fibroids, polyps, or ovarian cysts? : history of ovarian cysts  History of PCOS? no Hstory of Endometriosis? no History of abnormal  pap smears? no Have you had any sexually transmitted infections in the past? no  She received one dose of HPV vaccination in the past.   Last Pap: never, under 21  She identifies as a female. She is sexually active with men.   She admits to dyspareunia. She denies postcoital bleeding.  She currently uses Nexplanon for contraception.    Obstetric History: G0P0000  Family History:  Family History  Problem Relation Age of Onset   Cancer Paternal Grandfather        lung   Depression Paternal Grandfather      Social History:  Social History   Socioeconomic History   Marital status: Single    Spouse name: Not on file   Number of children: Not on file   Years of education: Not on file   Highest education level: Not on file  Occupational History   Not on file  Tobacco Use   Smoking status: Every Day    Packs/day: 1.00    Types: Cigarettes, E-cigarettes    Last attempt to quit: 08/29/2021    Years since quitting: 0.2   Smokeless tobacco: Never  Vaping Use   Vaping Use: Never used  Substance and Sexual Activity   Alcohol use: Not Currently    Alcohol/week: 35.0 standard drinks    Types: 35 Shots of liquor per week    Comment: drinks scotch, several drinks throughout the day, 5 per day is estimation   Drug use: Yes    Frequency: 7.0 times per week    Types: Marijuana    Comment: uses daily at least once or more per day   Sexual activity: Yes    Birth control/protection: Inserts    Comment: nuva ring   Other Topics Concern   Not on file  Social History Narrative   Not on file   Social Determinants of Health   Financial Resource Strain: Medium Risk   Difficulty of Paying Living Expenses: Somewhat hard  Food Insecurity: Not on file  Transportation Needs: Unmet Transportation Needs   Lack of Transportation (Medical): Yes   Lack of Transportation (Non-Medical): Yes  Physical Activity: Inactive   Days of Exercise per Week: 0 days   Minutes of Exercise per Session: 0 min  Stress: Stress Concern Present   Feeling of Stress : Very much  Social Connections: Socially Isolated   Frequency of Communication with Friends and Family: Never   Frequency of Social Gatherings with Friends and Family: Never   Attends Religious Services: Never   Marine scientist or Organizations: No   Attends Music therapist: Never   Marital Status: Never married  Human resources officer Violence: Not on file    Allergies:  No Active Allergies  Medications: Prior to Admission  medications   Medication Sig Start Date End Date Taking? Authorizing Provider  buPROPion (WELLBUTRIN XL) 300 MG 24 hr tablet Take 1 tablet (300 mg total) by mouth daily. Patient not taking: Reported on 12/13/2021 06/07/21   Lavera Guise, MD  EPINEPHrine (EPIPEN 2-PAK) 0.3 mg/0.3 mL IJ SOAJ injection Inject 0.3 mg into the muscle as needed for anaphylaxis. Patient not taking: Reported on 12/13/2021 02/23/21   Nance Pear, MD  ibuprofen (ADVIL) 100 MG tablet Take 100 mg by mouth every 6 (six) hours as needed for fever or pain. Patient not taking: Reported on 12/13/2021    [provider]  meloxicam (MOBIC) 7.5 MG tablet Take 1 tablet (7.5 mg total) by mouth daily. Patient not  taking: Reported on 12/13/2021 04/03/21   Theotis Burrow, NP  omeprazole (PRILOSEC) 20 MG capsule Take 1 capsule (20 mg total) by mouth daily. Patient not taking: Reported on 12/13/2021 04/03/21   Theotis Burrow, NP  ondansetron (ZOFRAN ODT) 4 MG disintegrating tablet Take 1 tablet (4 mg total) by mouth every 8 (eight) hours as needed for nausea or vomiting. Patient not taking: Reported on 12/13/2021 10/04/21   Arnaldo Natal, MD  ondansetron (ZOFRAN) 4 MG tablet Take 1 tablet (4 mg total) by mouth every 8 (eight) hours as needed for nausea or vomiting. Patient not taking: Reported on 12/13/2021 10/03/21   Sallyanne Kuster, NP  promethazine (PHENERGAN) 25 MG suppository Place 1 suppository (25 mg total) rectally every 6 (six) hours as needed for nausea or vomiting. Patient not taking: Reported on 12/13/2021 10/04/21   Arnaldo Natal, MD  Semaglutide, 1 MG/DOSE, 4 MG/3ML SOPN Inject 1 mg into the skin once a week. Patient not taking: Reported on 12/13/2021 10/02/21   Sallyanne Kuster, NP  traZODone (DESYREL) 100 MG tablet Take 1 tablet (100 mg total) by mouth at bedtime. Patient not taking: Reported on 12/13/2021 11/10/21   Sallyanne Kuster, NP    Physical Exam Vitals: Blood pressure 120/70, height 5\' 3"  (1.6  m), weight 198 lb (89.8 kg).  Physical Exam Constitutional:      Appearance: She is well-developed.  Genitourinary:     Genitourinary Comments: External: Normal appearing vulva. No lesions noted.  Speculum examination: Normal appearing cervix. No blood in the vaginal vault. No discharge.  Bimanual examination: Uterus midline, non-tender, normal in size, shape and contour.  No CMT. No adnexal masses. No adnexal tenderness. Pelvis not fixed.  Breast Exam: breast equal without skin changes, nipple discharge, breast lump or enlarged lymph nodes. Inverted nipples on exam.   HENT:     Head: Normocephalic and atraumatic.  Neck:     Thyroid: No thyromegaly.  Cardiovascular:     Rate and Rhythm: Normal rate and regular rhythm.     Heart sounds: Normal heart sounds.  Pulmonary:     Effort: Pulmonary effort is normal.     Breath sounds: Normal breath sounds.  Abdominal:     General: Bowel sounds are normal. There is no distension.     Palpations: Abdomen is soft. There is no mass.  Musculoskeletal:     Cervical back: Neck supple.  Neurological:     Mental Status: She is alert and oriented to person, place, and time.  Skin:    General: Skin is warm and dry.  Psychiatric:        Behavior: Behavior normal.        Thought Content: Thought content normal.        Judgment: Judgment normal.  Vitals reviewed.   Nexplanon Removal  Procedure Note Removal: Appropriate time out taken. Patient placed in dorsal supine with left arm above head, elbow flexed at 90 degrees, arm resting on examination table.  The Nexplanon was noted in the patient's arm and the end was identified. The skin was cleansed with a Betadine solution. A small injection of subcutaneous lidocaine with epinephrine was given over the end of the implant. An incision was made at the end of the implant. The rod was noted in the incision and grasped with a hemostat. It was noted to be intact.   The site covered dressed with steri strips  and a band aid before applying  a kerlex bandage pressure dressing. Minimal blood  loss was noted during the procedure.  The patient tolerated the procedure well.    Female chaperone present for pelvic and breast  portions of the physical exam  Assessment: 20 y.o. G0P0000 routine annual exam  Plan: Problem List Items Addressed This Visit   None Visit Diagnoses     Encounter for annual routine gynecological examination    -  Primary   Cervical cancer screening       Relevant Orders   Cytology - PAP   Screen for STD (sexually transmitted disease)       Relevant Orders   Cytology - PAP   Nexplanon removal       At risk for obstructive sleep apnea       Relevant Orders   Ambulatory referral to Sleep Studies       1) STI screening was offered and accepted  2) pap smear today  3) Contraception - none desired, Nexplanon removed at patient's request.   4) Routine healthcare maintenance including cholesterol, diabetes screening discussed managed by PCP  5) Encouraged smoking cessation, discussed pregnancy benefits. Patient reports vaping frequently.   6) Endorses risk factors for sleep apneaReferral for sleep study  Adrian Prows MD, [address], [facility] 12/13/2021 12:42 PM

## 2021-12-18 ENCOUNTER — Encounter: Payer: Medicaid Other | Admitting: Nurse Practitioner

## 2021-12-19 ENCOUNTER — Telehealth: Payer: Self-pay

## 2021-12-19 NOTE — Telephone Encounter (Signed)
Pt calling for test results and how to go about getting next shot for prevent cancer.  [phone]

## 2021-12-20 LAB — CYTOLOGY - PAP
Chlamydia: NEGATIVE
Comment: NEGATIVE
Comment: NEGATIVE
Comment: NORMAL
Diagnosis: NEGATIVE
Diagnosis: REACTIVE
Neisseria Gonorrhea: NEGATIVE
Trichomonas: NEGATIVE

## 2021-12-20 NOTE — Telephone Encounter (Signed)
She would need a second and third dose of the 9 valent HPV shot. Third dose will need to be at least 12 weeks after her 2nd dose. Please schedule for nurse visits to complete

## 2021-12-24 NOTE — Telephone Encounter (Signed)
Patient is scheduled for 01/07/22 for nurse visit

## 2022-01-07 ENCOUNTER — Ambulatory Visit: Payer: Medicaid Other

## 2022-02-19 ENCOUNTER — Ambulatory Visit: Payer: Medicaid Other | Admitting: Nurse Practitioner

## 2022-02-19 ENCOUNTER — Encounter: Payer: Self-pay | Admitting: Nurse Practitioner

## 2022-02-19 ENCOUNTER — Other Ambulatory Visit: Payer: Self-pay

## 2022-02-19 ENCOUNTER — Telehealth: Payer: Self-pay

## 2022-02-19 VITALS — BP 110/76 | HR 66 | Temp 98.4°F | Resp 16 | Ht 63.0 in | Wt 203.0 lb

## 2022-02-19 DIAGNOSIS — Z23 Encounter for immunization: Secondary | ICD-10-CM

## 2022-02-19 DIAGNOSIS — K219 Gastro-esophageal reflux disease without esophagitis: Secondary | ICD-10-CM | POA: Diagnosis not present

## 2022-02-19 DIAGNOSIS — E1165 Type 2 diabetes mellitus with hyperglycemia: Secondary | ICD-10-CM

## 2022-02-19 DIAGNOSIS — Z3201 Encounter for pregnancy test, result positive: Secondary | ICD-10-CM | POA: Diagnosis not present

## 2022-02-19 DIAGNOSIS — F1020 Alcohol dependence, uncomplicated: Secondary | ICD-10-CM

## 2022-02-19 DIAGNOSIS — F411 Generalized anxiety disorder: Secondary | ICD-10-CM

## 2022-02-19 DIAGNOSIS — F17200 Nicotine dependence, unspecified, uncomplicated: Secondary | ICD-10-CM

## 2022-02-19 DIAGNOSIS — R1011 Right upper quadrant pain: Secondary | ICD-10-CM | POA: Diagnosis not present

## 2022-02-19 LAB — POCT GLYCOSYLATED HEMOGLOBIN (HGB A1C): Hemoglobin A1C: 5.7 % — AB (ref 4.0–5.6)

## 2022-02-19 LAB — POCT PREGNANCY, URINE

## 2022-02-19 MED ORDER — SERTRALINE HCL 50 MG PO TABS: 50.0000 mg | ORAL_TABLET | Freq: Every day | ORAL | 3 refills | Status: DC

## 2022-02-19 MED ORDER — PANTOPRAZOLE SODIUM 40 MG PO TBEC: 40.0000 mg | DELAYED_RELEASE_TABLET | Freq: Every day | ORAL | 2 refills | Status: DC

## 2022-02-19 MED ORDER — SEMAGLUTIDE (1 MG/DOSE) 4 MG/3ML ~~LOC~~ SOPN: 1.0000 mg | PEN_INJECTOR | SUBCUTANEOUS | 5 refills | Status: DC

## 2022-02-19 MED ORDER — HPV 9-VALENT RECOMB VACCINE IM SUSP: 0.5000 mL | Freq: Once | INTRAMUSCULAR | 0 refills | Status: AC

## 2022-02-19 NOTE — Progress Notes (Signed)
[facility] [address] [place], [place] [address]  Internal MEDICINE  Office Visit Note  Patient Name: Natasha Hayes  [id number]  [id number]  Date of Service: 02/19/2022  Chief Complaint  Patient presents with   Follow-up    Discuss right side    Diabetes    HPI Natasha Hayes presents for a follow up visit for diabetes and repeat A1C. Since her previous office visit, she was seen by gynecology in December for her first pap and it was negative. She does not like her gynecologist and plans to have future pap smears done with her PCP.  She is still having pain in the right upper quadrant of the abdomen, reports that it is tender, feels like its getting worse, getting larger. She thinks it could be a hernia.  Her A1C is 5.7 today which is improved from 6.0 in October. She is taking ozempic but has not been able to get it due to cost recently. She has gained 5 lbs since her previous office visit.  She continues to be alcohol free. She had 1 drink when she turned 21 years old in September last year but has not had anymore alcohol since her birthday. She still reports that she does not want or crave it. She has also stopped smoking cigarettes since September last year but still does vape. She continues to smoke marijuana regularly and has no interest in quitting at this time.  --Had a positive pregnancy test - on 12/29, then started bleeding a week later.  She is due for the second dose of the HPV vaccine.  Acid reflux has increased, was previously on omeprazole but states that it did not work. Wants to try something different.  ---increased anxiety. Having problems with significant other who is drinking and not supportive of her health and medical problems and is increasing negativity in her life. Patient is trying to end her relationship with him but the situation is causing her increased anxiety and she would like to try something for anxiety.     Current Medication: Outpatient  Encounter Medications as of 02/19/2022  Medication Sig   hpv 9-valent vaccine (GARDASIL 9) SUSP injection Inject 0.5 mLs into the muscle once for 1 dose.   pantoprazole (PROTONIX) 40 MG tablet Take 1 tablet (40 mg total) by mouth daily.   sertraline (ZOLOFT) 50 MG tablet Take 1 tablet (50 mg total) by mouth daily.   Semaglutide, 1 MG/DOSE, 4 MG/3ML SOPN Inject 1 mg into the skin once a week.   traZODone (DESYREL) 100 MG tablet Take 1 tablet (100 mg total) by mouth at bedtime. (Patient not taking: Reported on 12/13/2021)   [DISCONTINUED] buPROPion (WELLBUTRIN XL) 300 MG 24 hr tablet Take 1 tablet (300 mg total) by mouth daily. (Patient not taking: Reported on 12/13/2021)   [DISCONTINUED] EPINEPHrine (EPIPEN 2-PAK) 0.3 mg/0.3 mL IJ SOAJ injection Inject 0.3 mg into the muscle as needed for anaphylaxis. (Patient not taking: Reported on 12/13/2021)   [DISCONTINUED] ibuprofen (ADVIL) 100 MG tablet Take 100 mg by mouth every 6 (six) hours as needed for fever or pain. (Patient not taking: Reported on 12/13/2021)   [DISCONTINUED] meloxicam (MOBIC) 7.5 MG tablet Take 1 tablet (7.5 mg total) by mouth daily. (Patient not taking: Reported on 12/13/2021)   [DISCONTINUED] omeprazole (PRILOSEC) 20 MG capsule Take 1 capsule (20 mg total) by mouth daily. (Patient not taking: Reported on 12/13/2021)   [DISCONTINUED] ondansetron (ZOFRAN ODT) 4 MG disintegrating tablet Take 1 tablet (4 mg total) by  mouth every 8 (eight) hours as needed for nausea or vomiting. (Patient not taking: Reported on 12/13/2021)   [DISCONTINUED] ondansetron (ZOFRAN) 4 MG tablet Take 1 tablet (4 mg total) by mouth every 8 (eight) hours as needed for nausea or vomiting. (Patient not taking: Reported on 12/13/2021)   [DISCONTINUED] promethazine (PHENERGAN) 25 MG suppository Place 1 suppository (25 mg total) rectally every 6 (six) hours as needed for nausea or vomiting. (Patient not taking: Reported on 12/13/2021)   [DISCONTINUED] Semaglutide, 1  MG/DOSE, 4 MG/3ML SOPN Inject 1 mg into the skin once a week. (Patient not taking: Reported on 12/13/2021)   No facility-administered encounter medications on file as of 02/19/2022.    Surgical History: Past Surgical History:  Procedure Laterality Date   NO PAST SURGERIES      Medical History: Past Medical History:  Diagnosis Date   ADHD    Diabetes mellitus without complication (HCC)    Insomnia    Mood disorder (HCC)    Renal disorder    kdiney stones    Family History: Family History  Problem Relation Age of Onset   Cancer Paternal Grandfather        lung   Depression Paternal Grandfather     Social History   Socioeconomic History   Marital status: Single    Spouse name: Not on file   Number of children: Not on file   Years of education: Not on file   Highest education level: Not on file  Occupational History   Not on file  Tobacco Use   Smoking status: Every Day    Packs/day: 1.00    Types: Cigarettes, E-cigarettes    Last attempt to quit: 08/29/2021    Years since quitting: 0.4   Smokeless tobacco: Never  Vaping Use   Vaping Use: Never used  Substance and Sexual Activity   Alcohol use: Not Currently    Alcohol/week: 35.0 standard drinks    Types: 35 Shots of liquor per week    Comment: drinks scotch, several drinks throughout the day, 5 per day is estimation   Drug use: Yes    Frequency: 7.0 times per week    Types: Marijuana    Comment: uses daily at least once or more per day   Sexual activity: Yes    Birth control/protection: Inserts    Comment: nuva ring   Other Topics Concern   Not on file  Social History Narrative   Not on file   Social Determinants of Health   Financial Resource Strain: Medium Risk   Difficulty of Paying Living Expenses: Somewhat hard  Food Insecurity: Not on file  Transportation Needs: Unmet Transportation Needs   Lack of Transportation (Medical): Yes   Lack of Transportation (Non-Medical): Yes  Physical Activity:  Inactive   Days of Exercise per Week: 0 days   Minutes of Exercise per Session: 0 min  Stress: Stress Concern Present   Feeling of Stress : Very much  Social Connections: Socially Isolated   Frequency of Communication with Friends and Family: Never   Frequency of Social Gatherings with Friends and Family: Never   Attends Religious Services: Never   Database administrator or Organizations: No   Attends Banker Meetings: Never   Marital Status: Never married  Catering manager Violence: Not on file      Review of Systems  Constitutional:  Negative for chills, fatigue and unexpected weight change.  HENT:  Negative for congestion, rhinorrhea, sneezing and sore throat.  Eyes:  Negative for redness.  Respiratory:  Negative for cough, chest tightness, shortness of breath and wheezing.   Cardiovascular:  Negative for chest pain and palpitations.  Gastrointestinal:  Positive for abdominal pain (RUQ). Negative for constipation, diarrhea, nausea and vomiting.  Genitourinary:  Negative for dysuria and frequency.  Musculoskeletal:  Negative for arthralgias, back pain, joint swelling and neck pain.  Skin:  Negative for rash.  Neurological: Negative.  Negative for tremors and numbness.  Hematological:  Negative for adenopathy. Does not bruise/bleed easily.  Psychiatric/Behavioral:  Negative for behavioral problems (Depression), sleep disturbance and suicidal ideas. The patient is not nervous/anxious.    Vital Signs: BP 110/76    Pulse 66    Temp 98.4 F (36.9 C)    Resp 16    Ht 5\' 3"  (1.6 m)    Wt 203 lb (92.1 kg)    SpO2 98%    BMI 35.96 kg/m    Physical Exam Vitals reviewed.  Constitutional:      General: She is not in acute distress.    Appearance: Normal appearance. She is obese. She is not ill-appearing.  HENT:     Head: Normocephalic and atraumatic.  Eyes:     Pupils: Pupils are equal, round, and reactive to light.  Cardiovascular:     Rate and Rhythm: Normal rate  and regular rhythm.  Pulmonary:     Effort: Pulmonary effort is normal. No respiratory distress.  Abdominal:     General: Bowel sounds are normal. There is distension.     Palpations: Abdomen is soft. There is mass.     Tenderness: There is abdominal tenderness in the right upper quadrant.  Neurological:     Mental Status: She is alert and oriented to person, place, and time.     Cranial Nerves: No cranial nerve deficit.     Motor: No weakness.  Psychiatric:        Mood and Affect: Mood normal.        Behavior: Behavior normal.       Assessment/Plan: 1. Type 2 diabetes mellitus with hyperglycemia, without long-term current use of insulin (HCC) A1C is 5.7 today, patient is stable, doing well with glucose meter. Reordered ozempic and sent to a different pharmacy to see if it is more affordable. Urine microalbumin sent.  - POCT glycosylated hemoglobin (Hb A1C) - Urine Microalbumin w/creat. ratio - Semaglutide, 1 MG/DOSE, 4 MG/3ML SOPN; Inject 1 mg into the skin once a week.  Dispense: 3 mL; Refill: 5  2. Right upper quadrant abdominal pain CT abdomen pelvis reordered. Pain has gotten worse.  - CT Abdomen Pelvis Wo Contrast; Future  3. Gastroesophageal reflux disease without esophagitis Try pantoprazole, follow up in 1 month - pantoprazole (PROTONIX) 40 MG tablet; Take 1 tablet (40 mg total) by mouth daily.  Dispense: 30 tablet; Refill: 2  4. Positive urine pregnancy test Urine pregnancy negative in office today. - POCT Pregnancy, Urine  5. Need for HPV vaccine Due for second dose in 3 dose series, sent to pharmacy.  - hpv 9-valent vaccine (GARDASIL 9) SUSP injection; Inject 0.5 mLs into the muscle once for 1 dose.  Dispense: 0.5 mL; Refill: 0  6. GAD (generalized anxiety disorder) Significantly increased anxiety, patient has tried zoloft before with success. Zoloft 50 mg daily prescribed, follow up in 1 month.  - sertraline (ZOLOFT) 50 MG tablet; Take 1 tablet (50 mg total)  by mouth daily.  Dispense: 30 tablet; Refill: 3  7. Tobacco dependence Still  vaping but no longer smoking cigarettes.  8. Alcohol dependence, daily use (HCC) Resolved. But still monitoring and checking in with patient to encourage continued sobriety.    General Counseling: De BlanchHaley verbalizes understanding of the findings of todays visit and agrees with plan of treatment. I have discussed any further diagnostic evaluation that may be needed or ordered today. We also reviewed her medications today. she has been encouraged to call the office with any questions or concerns that should arise related to todays visit.    Orders Placed This Encounter  Procedures   CT Abdomen Pelvis Wo Contrast   Urine Microalbumin w/creat. ratio   POCT glycosylated hemoglobin (Hb A1C)    Meds ordered this encounter  Medications   Semaglutide, 1 MG/DOSE, 4 MG/3ML SOPN    Sig: Inject 1 mg into the skin once a week.    Dispense:  3 mL    Refill:  5   pantoprazole (PROTONIX) 40 MG tablet    Sig: Take 1 tablet (40 mg total) by mouth daily.    Dispense:  30 tablet    Refill:  2   sertraline (ZOLOFT) 50 MG tablet    Sig: Take 1 tablet (50 mg total) by mouth daily.    Dispense:  30 tablet    Refill:  3   hpv 9-valent vaccine (GARDASIL 9) SUSP injection    Sig: Inject 0.5 mLs into the muscle once for 1 dose.    Dispense:  0.5 mL    Refill:  0    Patient had first dose early 2022, this is the second dose.    Return in about 1 month (around 03/19/2022) for F/U, Hymen Arnett PCP, eval new med, Anxiety/depression.   Total time spent:30 Minutes Time spent includes review of chart, medications, test results, and follow up plan with the patient.   [place] Controlled Substance Database was reviewed by me.  This patient was seen by Sallyanne KusterAlyssa Rhyan Radler, FNP-C in collaboration with Dr. Beverely RisenFozia Khan as a part of collaborative care agreement.   Adine Heimann R. Tedd SiasAbernathy, MSN, FNP-C Internal medicine

## 2022-02-19 NOTE — Telephone Encounter (Signed)
Routine CT abdomen ordered. Printed. Gave to [employer]

## 2022-02-20 ENCOUNTER — Telehealth: Payer: Self-pay

## 2022-02-20 LAB — MICROALBUMIN / CREATININE URINE RATIO
Creatinine, Urine: 70.8 mg/dL
Microalb/Creat Ratio: <4 mg/g creat (ref 0–29)
Microalbumin, Urine: <3 ug/mL

## 2022-02-21 ENCOUNTER — Telehealth: Payer: Self-pay

## 2022-02-21 NOTE — Telephone Encounter (Signed)
error 

## 2022-02-21 NOTE — Telephone Encounter (Signed)
Pt came in to the office and I gave instructions on how to inject the ozempic medication.  I also gave pt some samples of the novo fine pen needles to use with the insulin

## 2022-02-25 ENCOUNTER — Telehealth: Payer: Self-pay

## 2022-02-25 NOTE — Telephone Encounter (Signed)
Patient has been advised that she is scheduled for ct abdomen on 03/04/22 @ 11:30 [place].

## 2022-02-27 ENCOUNTER — Telehealth: Payer: Self-pay

## 2022-02-27 NOTE — Telephone Encounter (Signed)
Pt advised that we made her appt for tomorrow  ?

## 2022-02-28 ENCOUNTER — Ambulatory Visit: Payer: Medicaid Other | Admitting: Nurse Practitioner

## 2022-02-28 DIAGNOSIS — Z0289 Encounter for other administrative examinations: Secondary | ICD-10-CM

## 2022-03-04 ENCOUNTER — Ambulatory Visit: Admission: RE | Admit: 2022-03-04 | Payer: Medicaid Other | Source: Ambulatory Visit

## 2022-03-12 ENCOUNTER — Ambulatory Visit: Admission: RE | Admit: 2022-03-12 | Payer: Medicaid Other | Source: Ambulatory Visit

## 2022-03-19 ENCOUNTER — Ambulatory Visit: Payer: Medicaid Other | Admitting: Nurse Practitioner

## 2022-03-27 ENCOUNTER — Telehealth: Payer: Self-pay

## 2022-03-27 NOTE — Telephone Encounter (Signed)
Patient has been dismissed from [facility] due to no compliancy. Patient has been emailed a discharge letter and copy has been placed in scan. ?

## 2022-04-20 IMAGING — CT CT ANGIO CHEST
2 of 6 series · 18 of 46 positions shown · IV contrast (APPLIED)
Comparison: None.

CLINICAL DATA: Right upper quadrant pain

EXAM:
CT ANGIOGRAPHY CHEST WITH CONTRAST
TECHNIQUE: Multidetector CT imaging of the chest was performed using the
standard protocol during bolus administration of intravenous
contrast. Multiplanar CT image reconstructions and MIPs were
obtained to evaluate the vascular anatomy.
CONTRAST:  100mL OMNIPAQUE IOHEXOL 300 MG/ML  SOLN

[Series 5: thins · axial · 0.63mm/px · z∈[-303,-62]mm · 15 of 265 slices shown]
[im 12/265  lung]
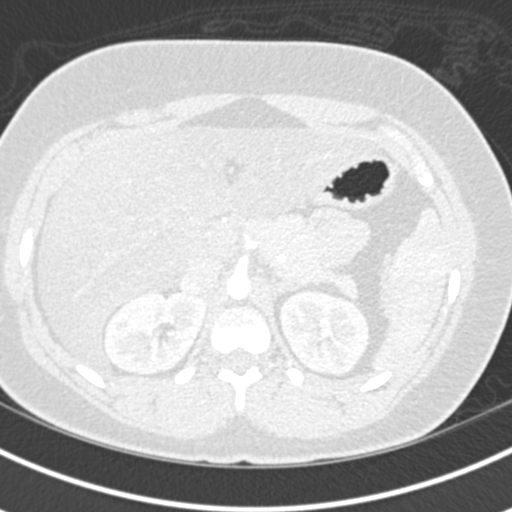
[im 35/265  soft-tissue]
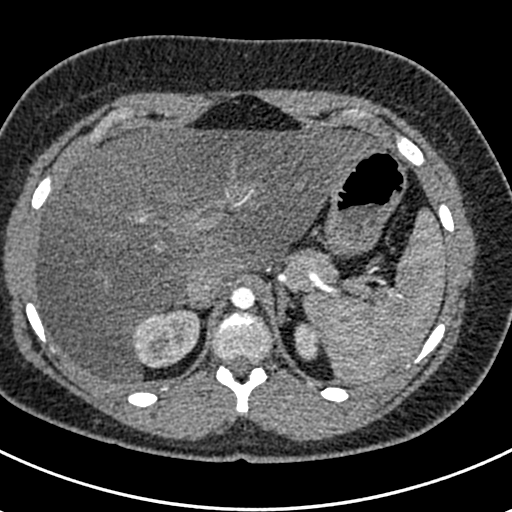
[im 46/265  lung]
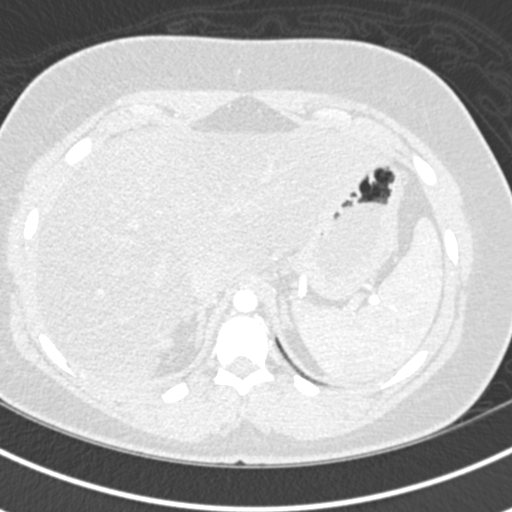
[im 69/265  soft-tissue]
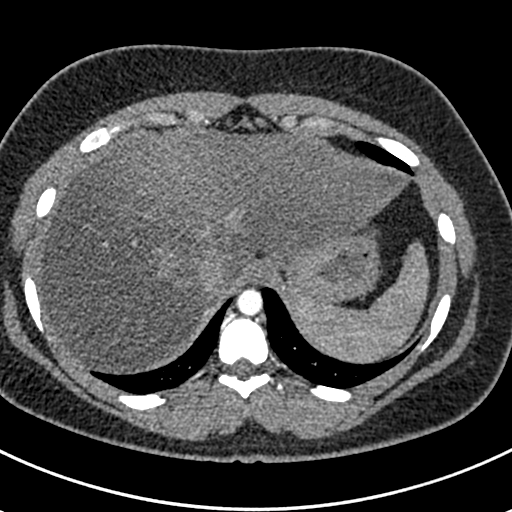
[im 81/265  lung]
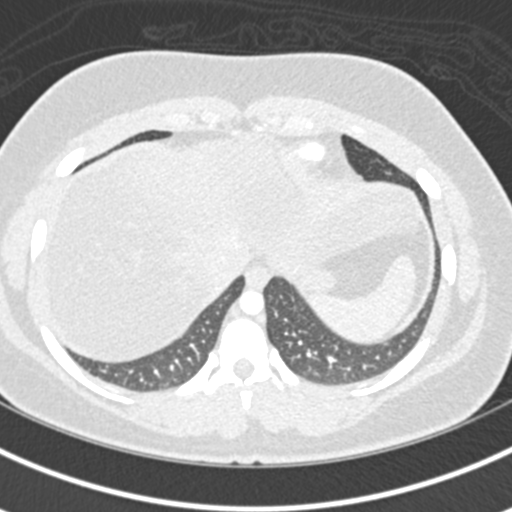
[im 104/265  soft-tissue]
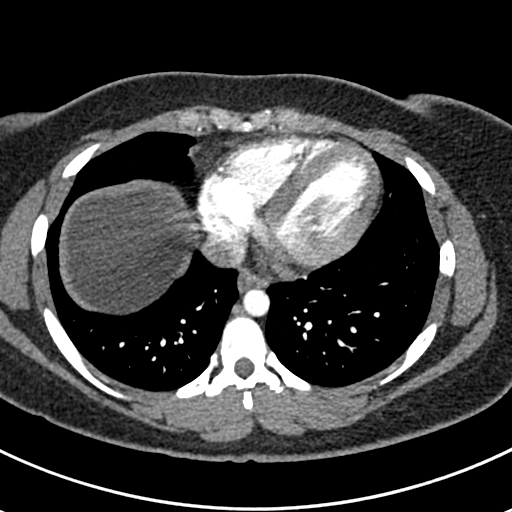
[im 115/265  lung]
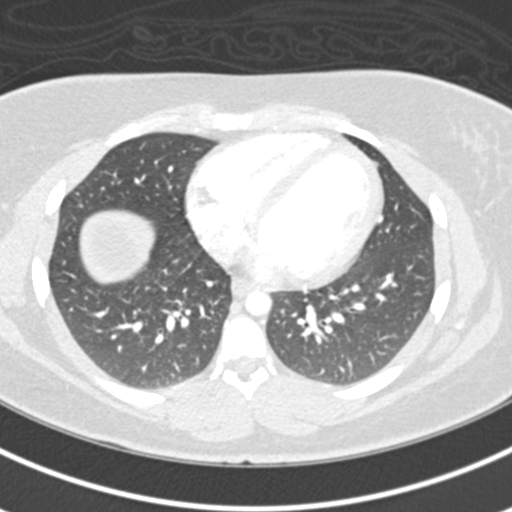
[im 138/265  soft-tissue]
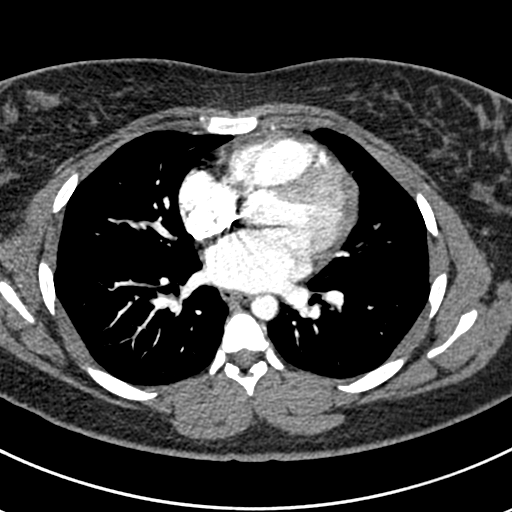
[im 150/265  lung]
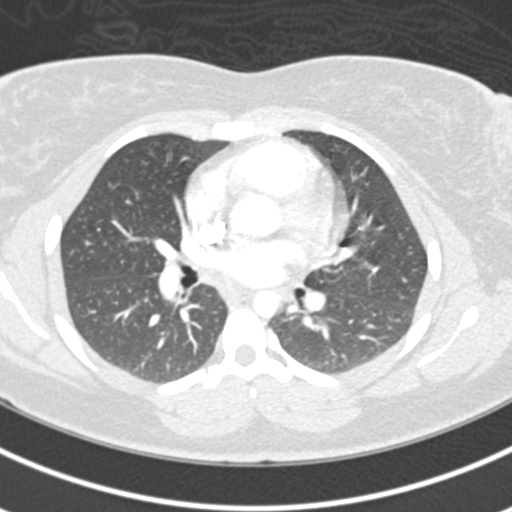
[im 161/265  soft-tissue]
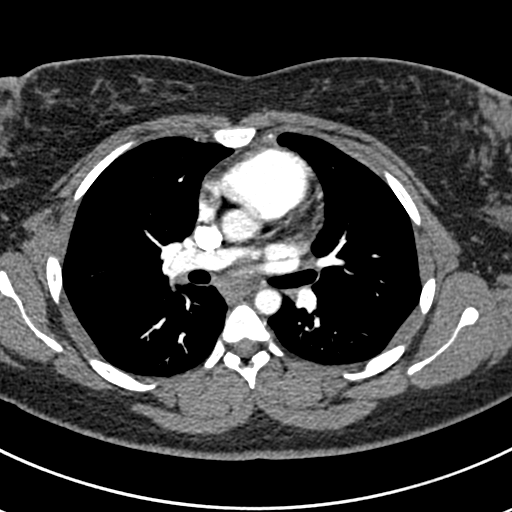
[im 184/265  lung]
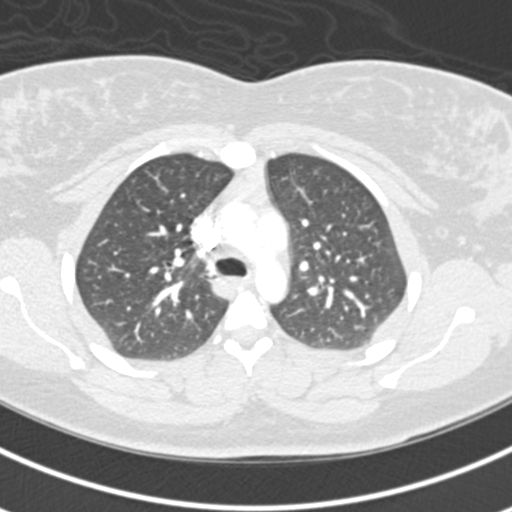
[im 196/265  soft-tissue]
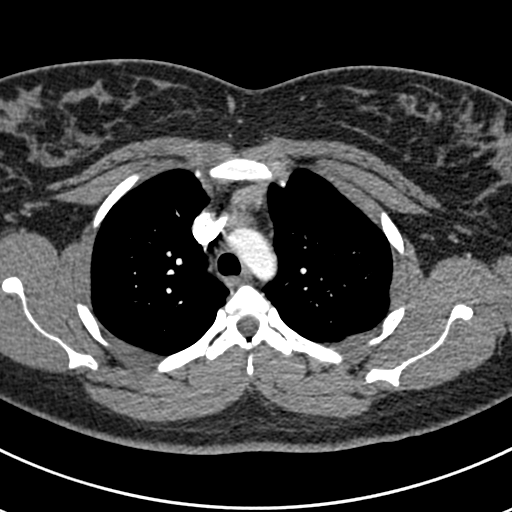
[im 219/265  lung]
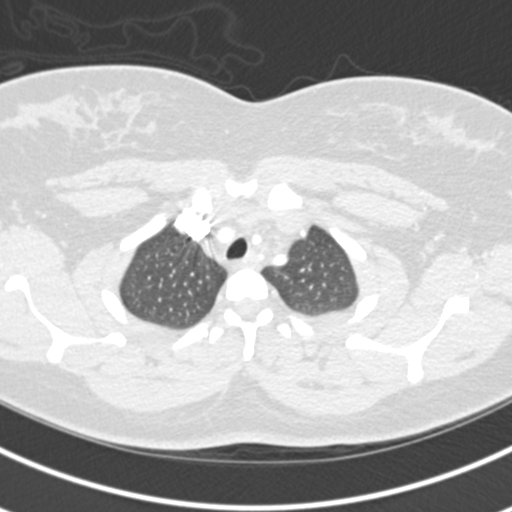
[im 230/265  soft-tissue]
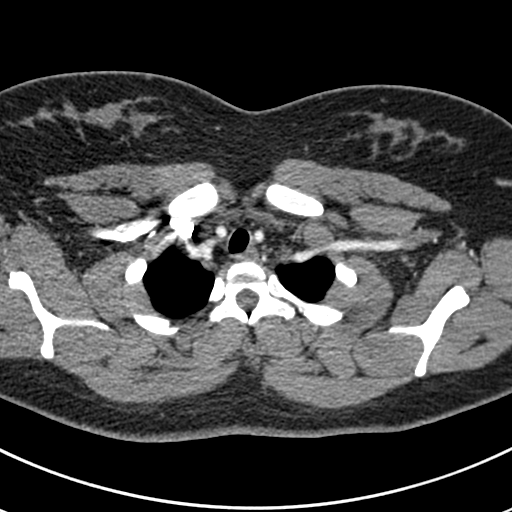
[im 253/265  lung]
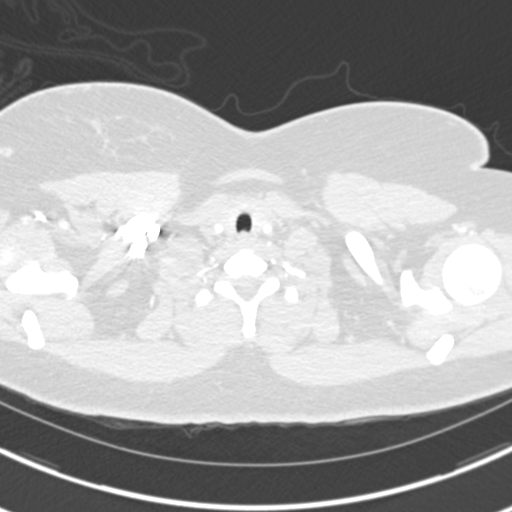

[Series 7: coronal mpr · coronal · 0.54mm/px · 3 of 83 slices shown]
[im 21/83  soft-tissue]
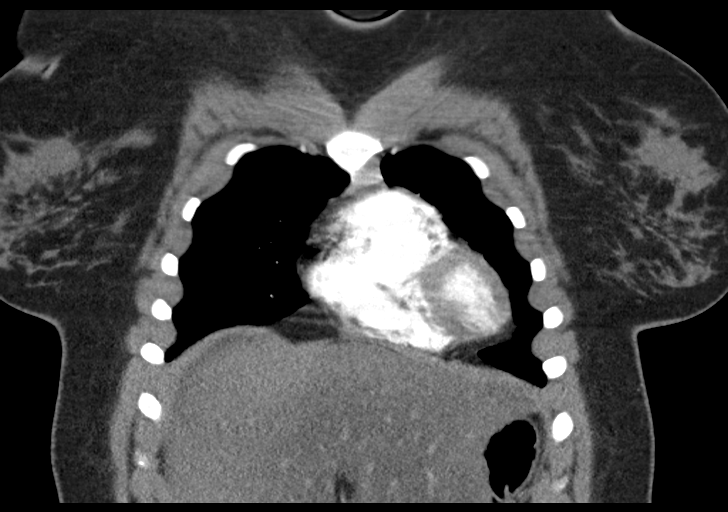
[im 42/83  soft-tissue]
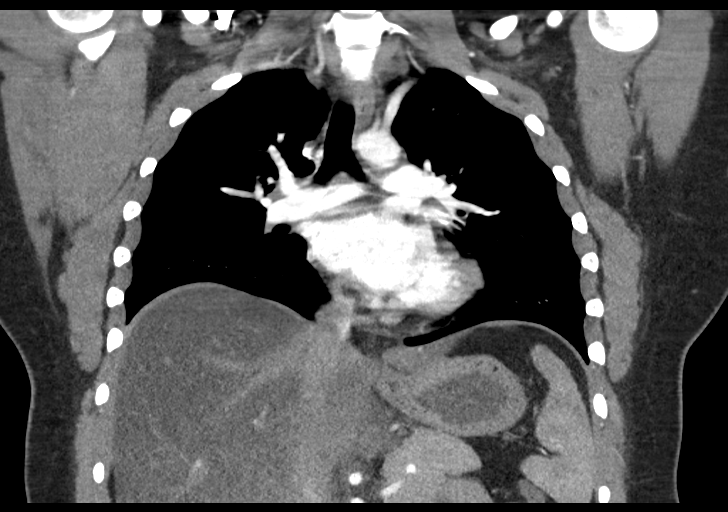
[im 62/83  soft-tissue]
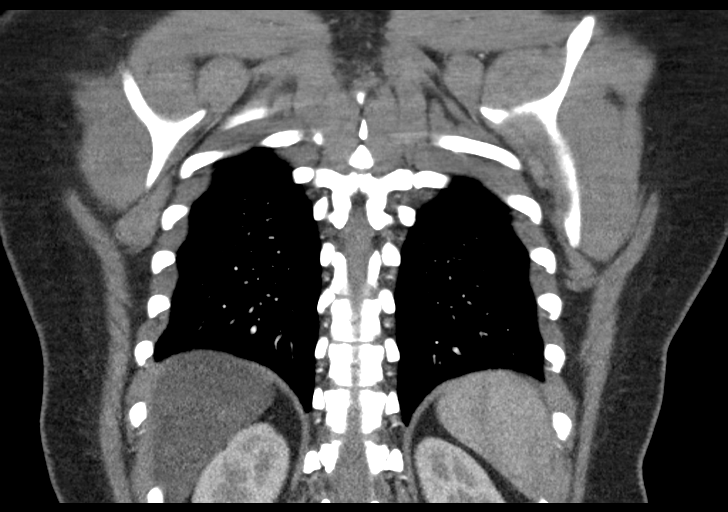

[18 of 46 positions shown; findings below may reference images not displayed]

FINDINGS: Cardiovascular: No filling defects in the pulmonary arteries to
suggest pulmonary emboli. Heart is normal size. Aorta is normal
caliber.

Mediastinum/Nodes: No mediastinal, hilar, or axillary adenopathy.
Trachea and esophagus are unremarkable. Thyroid unremarkable. Soft
tissue in the anterior mediastinum felt represent residual thymus.

Lungs/Pleura: Lungs are clear. No focal airspace opacities or
suspicious nodules. No effusions.

Upper Abdomen: Severe diffuse fatty infiltration of the liver.

Musculoskeletal: Chest wall soft tissues are unremarkable. No acute
bony abnormality.

Review of the MIP images confirms the above findings.
IMPRESSION: No evidence of pulmonary embolus.

Severe hepatic steatosis.

No acute cardiopulmonary disease.

## 2022-07-08 ENCOUNTER — Emergency Department
Admission: EM | Admit: 2022-07-08 | Discharge: 2022-07-08 | Discharge status: Against Medical Advice | Payer: Medicaid Other | Attending: Emergency Medicine | Admitting: Emergency Medicine

## 2022-07-08 ENCOUNTER — Other Ambulatory Visit: Payer: Self-pay

## 2022-07-08 DIAGNOSIS — Z5321 Procedure and treatment not carried out due to patient leaving prior to being seen by health care provider: Secondary | ICD-10-CM | POA: Diagnosis not present

## 2022-07-08 DIAGNOSIS — R109 Unspecified abdominal pain: Secondary | ICD-10-CM | POA: Diagnosis present

## 2022-07-08 LAB — COMPREHENSIVE METABOLIC PANEL
ALT: 29 U/L (ref 0–44)
AST: 31 U/L (ref 15–41)
Albumin: 4.9 g/dL (ref 3.5–5.0)
Alkaline Phosphatase: 64 U/L (ref 38–126)
Anion gap: 13 (ref 5–15)
BUN: 10 mg/dL (ref 6–20)
CO2: 20 mmol/L — ABNORMAL LOW (ref 22–32)
Calcium: 9.9 mg/dL (ref 8.9–10.3)
Chloride: 104 mmol/L (ref 98–111)
Creatinine, Ser: 0.75 mg/dL (ref 0.44–1.00)
GFR, Estimated: >60 mL/min (ref >60)
Glucose, Bld: 97 mg/dL (ref 70–99)
Potassium: 3.6 mmol/L (ref 3.5–5.1)
Sodium: 137 mmol/L (ref 135–145)
Total Bilirubin: 0.9 mg/dL (ref 0.3–1.2)
Total Protein: 7.6 g/dL (ref 6.5–8.1)

## 2022-07-08 LAB — CBC
HCT: 43.1 % (ref 36.0–46.0)
Hemoglobin: 14.7 g/dL (ref 12.0–15.0)
MCH: 30.8 pg (ref 26.0–34.0)
MCHC: 34.1 g/dL (ref 30.0–36.0)
MCV: 90.2 fL (ref 80.0–100.0)
Platelets: 254 10*3/uL (ref 150–400)
RBC: 4.78 MIL/uL (ref 3.87–5.11)
RDW: 11.9 % (ref 11.5–15.5)
WBC: 11.7 10*3/uL — ABNORMAL HIGH (ref 4.0–10.5)
nRBC: 0 % (ref 0.0–0.2)

## 2022-07-08 LAB — LIPASE, BLOOD: Lipase: 27 U/L (ref 11–51)

## 2022-07-08 NOTE — ED Triage Notes (Signed)
Pt BIB EMS for ABD pain that started today. Pt denies n/v. Pt screaming in pain during triage.

## 2023-01-23 ENCOUNTER — Other Ambulatory Visit: Payer: Self-pay

## 2023-01-23 ENCOUNTER — Emergency Department
Admission: EM | Admit: 2023-01-23 | Discharge: 2023-01-23 | Disposition: A | Discharge status: Home / Self Care | Payer: Medicaid Other | Attending: Emergency Medicine | Admitting: Emergency Medicine

## 2023-01-23 DIAGNOSIS — R7309 Other abnormal glucose: Secondary | ICD-10-CM | POA: Diagnosis not present

## 2023-01-23 DIAGNOSIS — N3001 Acute cystitis with hematuria: Secondary | ICD-10-CM | POA: Diagnosis not present

## 2023-01-23 DIAGNOSIS — M545 Low back pain, unspecified: Secondary | ICD-10-CM | POA: Diagnosis present

## 2023-01-23 LAB — POC URINE PREG, ED: Preg Test, Ur: NEGATIVE

## 2023-01-23 LAB — URINALYSIS, ROUTINE W REFLEX MICROSCOPIC
Bilirubin Urine: NEGATIVE
Glucose, UA: NEGATIVE mg/dL
Ketones, ur: NEGATIVE mg/dL
Nitrite: NEGATIVE
Protein, ur: NEGATIVE mg/dL
Specific Gravity, Urine: 1.02 (ref 1.005–1.030)
WBC, UA: >50 WBC/hpf (ref 0–5)
pH: 6 (ref 5.0–8.0)

## 2023-01-23 LAB — CBG MONITORING, ED: Glucose-Capillary: 79 mg/dL (ref 70–99)

## 2023-01-23 MED ORDER — CEPHALEXIN 500 MG PO CAPS
500.0000 mg | ORAL_CAPSULE | Freq: Once | ORAL | Status: AC
  Administered 2023-01-23: 500 mg via ORAL
  Filled 2023-01-23: qty 1

## 2023-01-23 MED ORDER — CEPHALEXIN 500 MG PO CAPS: 500.0000 mg | ORAL_CAPSULE | Freq: Four times a day (QID) | ORAL | 0 refills | Status: AC

## 2023-01-23 NOTE — Discharge Instructions (Signed)
Take Keflex four times daily for the next seven days.

## 2023-01-23 NOTE — ED Provider Notes (Signed)
[facility] Provider Note  Patient Contact: 7:02 PM (approximate)   History   No chief complaint on file.   HPI  Natasha Hayes is a 22 y.o. female with a history of ADHD, presents to the emergency department with concern for possible pregnancy and a urinary tract infection.  Patient has had dysuria and increased urinary frequency as well as some nausea and low back pain.  No vomiting at home.  No chest pain, chest tightness or abdominal pain.      Physical Exam   Triage Vital Signs: ED Triage Vitals [01/23/23 1725]  Enc Vitals Group     BP 127/88     Pulse Rate (!) 106     Resp 18     Temp 98.2 F (36.8 C)     Temp Source Oral     SpO2 98 %     Weight      Height      Head Circumference      Peak Flow      Pain Score 0     Pain Loc      Pain Edu?      Excl. in [place]?     Most recent vital signs: Vitals:   01/23/23 1725  BP: 127/88  Pulse: (!) 106  Resp: 18  Temp: 98.2 F (36.8 C)  SpO2: 98%     General: Alert and in no acute distress. Eyes:  PERRL. EOMI. Head: No acute traumatic findings ENT:      Nose: No congestion/rhinnorhea.      Mouth/Throat: Mucous membranes are moist. Neck: No stridor. No cervical spine tenderness to palpation. Cardiovascular:  Good peripheral perfusion Respiratory: Normal respiratory effort without tachypnea or retractions. Lungs CTAB. Good air entry to the bases with no decreased or absent breath sounds. Gastrointestinal: Bowel sounds 4 quadrants. Soft and nontender to palpation. No guarding or rigidity. No palpable masses. No distention. No CVA tenderness. Musculoskeletal: Full range of motion to all extremities.  Neurologic:  No gross focal neurologic deficits are appreciated.     ED Results / Procedures / Treatments   Labs (all labs ordered are listed, but only abnormal results are displayed) Labs Reviewed  URINALYSIS, ROUTINE W REFLEX MICROSCOPIC - Abnormal; Notable for the following  components:      Result Value   Color, Urine YELLOW (*)    APPearance HAZY (*)    Hgb urine dipstick SMALL (*)    Leukocytes,Ua MODERATE (*)    Bacteria, UA RARE (*)    All other components within normal limits  POC URINE PREG, ED        PROCEDURES:  Critical Care performed: No  Procedures   MEDICATIONS ORDERED IN ED: Medications  cephALEXin (KEFLEX) capsule 500 mg (has no administration in time range)     IMPRESSION / MDM / ASSESSMENT AND PLAN / ED COURSE  I reviewed the triage vital signs and the nursing notes.                              Assessment and plan UTI 22 year old female presents to the emergency department with dysuria and concern for possible pregnancy.  Patient was mildly tachycardic at triage but vital signs were otherwise reassuring.  On exam, patient was alert and nontoxic-appearing.  Urine pregnancy test was negative.  Will start patient on Keflex 4 times daily for the next 7 days.  Return precautions were given to return  with new or worsening symptoms.     FINAL CLINICAL IMPRESSION(S) / ED DIAGNOSES   Final diagnoses:  Acute cystitis with hematuria     Rx / DC Orders   ED Discharge Orders          Ordered    cephALEXin (KEFLEX) 500 MG capsule  4 times daily        01/23/23 1901             Note:  This document was prepared using Dragon voice recognition software and may include unintentional dictation errors.   Vallarie Mare [place], PA-C 01/23/23 Drema Halon    Lavonia Drafts, MD 01/23/23 [phone]

## 2023-01-23 NOTE — ED Notes (Signed)
Discharge instructions explained to patient and family at this time. Patient and family state they understand and agree.   

## 2023-01-23 NOTE — ED Triage Notes (Signed)
Pt to ED via POV from home. Pt reports possible pregnancy and got off period on 1/15. Pt reports positive at home pregnancy test. Pt also reports possible UTI with increased frequency and odor.

## 2023-02-18 ENCOUNTER — Ambulatory Visit (LOCAL_COMMUNITY_HEALTH_CENTER): Payer: Medicaid Other

## 2023-02-18 VITALS — BP 132/85 | Ht 63.0 in | Wt 183.5 lb

## 2023-02-18 DIAGNOSIS — Z3201 Encounter for pregnancy test, result positive: Secondary | ICD-10-CM

## 2023-02-18 LAB — PREGNANCY, URINE: Preg Test, Ur: POSITIVE — AB

## 2023-02-18 MED ORDER — PRENATAL 27-0.8 MG PO TABS: 1.0000 | ORAL_TABLET | Freq: Every day | ORAL | 0 refills | Status: AC

## 2023-02-18 NOTE — Progress Notes (Signed)
Client seen in nurse clinic for positive Pregancy Test.  Client reports history of Type 2 Diabetes but currently taking medication.    Reports not taking any medication at this time.  Client has several medications listed in EPIC and reports she is NOT taking any medication except prenatal vitamins.     Client reports she has a history of drinking 2 years ago.  Client reports she has not been drinking alcohol and has not really drank in the past 2 years.    Client reports smoking daily and vaping.   Smoking Cessation brochure and quit line information provided.  Client also reports marijuana use, but states she has stopped now that she is pregnant.     Client encouraged to call primary care provider to follow up related to Type 2 Diabetes.   She reports she will call today.  Resource list of local obstetricians provided.   Client reports she plans to to call [facility] today to schedule and apt.    Client also reported she plans to go visit [place] today.        The patient was dispensed Prenatal today. I provided counseling today regarding the medication. We discussed the medication, the side effects and when to call clinic. Patient given the opportunity to ask questions. Questions answered.  Client counseled to only take ONE prenatal vitamin daily.    Correen Bubolz Shelda Pal, RN

## 2023-02-20 ENCOUNTER — Other Ambulatory Visit: Payer: Self-pay

## 2023-02-20 ENCOUNTER — Emergency Department
Admission: EM | Admit: 2023-02-20 | Discharge: 2023-02-20 | Disposition: A | Discharge status: Home / Self Care | Payer: Medicaid Other | Attending: Emergency Medicine | Admitting: Emergency Medicine

## 2023-02-20 DIAGNOSIS — Z3A01 Less than 8 weeks gestation of pregnancy: Secondary | ICD-10-CM | POA: Diagnosis not present

## 2023-02-20 DIAGNOSIS — O99891 Other specified diseases and conditions complicating pregnancy: Secondary | ICD-10-CM | POA: Diagnosis present

## 2023-02-20 DIAGNOSIS — D72829 Elevated white blood cell count, unspecified: Secondary | ICD-10-CM | POA: Diagnosis not present

## 2023-02-20 DIAGNOSIS — E119 Type 2 diabetes mellitus without complications: Secondary | ICD-10-CM | POA: Diagnosis not present

## 2023-02-20 DIAGNOSIS — O210 Mild hyperemesis gravidarum: Secondary | ICD-10-CM | POA: Insufficient documentation

## 2023-02-20 LAB — URINALYSIS, ROUTINE W REFLEX MICROSCOPIC
Bilirubin Urine: NEGATIVE
Glucose, UA: NEGATIVE mg/dL
Hgb urine dipstick: NEGATIVE
Ketones, ur: 5 mg/dL — AB
Nitrite: NEGATIVE
Protein, ur: NEGATIVE mg/dL
Specific Gravity, Urine: 1.021 (ref 1.005–1.030)
pH: 6 (ref 5.0–8.0)

## 2023-02-20 LAB — COMPREHENSIVE METABOLIC PANEL
ALT: 21 U/L (ref 0–44)
AST: 17 U/L (ref 15–41)
Albumin: 4.1 g/dL (ref 3.5–5.0)
Alkaline Phosphatase: 54 U/L (ref 38–126)
Anion gap: 13 (ref 5–15)
BUN: 12 mg/dL (ref 6–20)
CO2: 19 mmol/L — ABNORMAL LOW (ref 22–32)
Calcium: 8.8 mg/dL — ABNORMAL LOW (ref 8.9–10.3)
Chloride: 102 mmol/L (ref 98–111)
Creatinine, Ser: 0.45 mg/dL (ref 0.44–1.00)
GFR, Estimated: >60 mL/min (ref >60)
Glucose, Bld: 101 mg/dL — ABNORMAL HIGH (ref 70–99)
Potassium: 3.7 mmol/L (ref 3.5–5.1)
Sodium: 134 mmol/L — ABNORMAL LOW (ref 135–145)
Total Bilirubin: 0.8 mg/dL (ref 0.3–1.2)
Total Protein: 7.1 g/dL (ref 6.5–8.1)

## 2023-02-20 LAB — CBC
HCT: 43.8 % (ref 36.0–46.0)
Hemoglobin: 15 g/dL (ref 12.0–15.0)
MCH: 30.5 pg (ref 26.0–34.0)
MCHC: 34.2 g/dL (ref 30.0–36.0)
MCV: 89 fL (ref 80.0–100.0)
Platelets: 262 10*3/uL (ref 150–400)
RBC: 4.92 MIL/uL (ref 3.87–5.11)
RDW: 11.9 % (ref 11.5–15.5)
WBC: 13.1 10*3/uL — ABNORMAL HIGH (ref 4.0–10.5)
nRBC: 0 % (ref 0.0–0.2)

## 2023-02-20 LAB — LIPASE, BLOOD: Lipase: 31 U/L (ref 11–51)

## 2023-02-20 MED ORDER — DOXYLAMINE-PYRIDOXINE ER 20-20 MG PO TBCR: EXTENDED_RELEASE_TABLET | ORAL | 0 refills | Status: DC

## 2023-02-20 MED ORDER — DIPHENHYDRAMINE HCL 50 MG/ML IJ SOLN
25.0000 mg | Freq: Once | INTRAMUSCULAR | Status: AC
  Administered 2023-02-20: 25 mg via INTRAVENOUS
  Filled 2023-02-20: qty 1

## 2023-02-20 MED ORDER — METOCLOPRAMIDE HCL 5 MG/ML IJ SOLN
10.0000 mg | Freq: Once | INTRAMUSCULAR | Status: AC
  Administered 2023-02-20: 10 mg via INTRAVENOUS
  Filled 2023-02-20: qty 2

## 2023-02-20 MED ORDER — LACTATED RINGERS IV BOLUS
1000.0000 mL | Freq: Once | INTRAVENOUS | Status: AC
  Administered 2023-02-20: 1000 mL via INTRAVENOUS

## 2023-02-20 NOTE — Discharge Instructions (Addendum)
Please stick to clear liquids or you are feeling sick and you can advance your diet to bland solids like saltine crackers or toast.  Start taking the doxylamine and pyridoxine.  You can start taking it nightly and if you continue to have symptoms can take 1 tablet in the morning.  Please follow-up with OB/GYN.
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
## 2023-02-20 NOTE — ED Notes (Signed)
Pt verbalizes understanding of discharge instructions. Opportunity for questioning and answers were provided. Pt discharged from ED to home with significant other.   ? ?

## 2023-02-20 NOTE — ED Triage Notes (Signed)
Pt to ED via POV from home. Pt reports approximately [redacted]wks pregnant and has not confirmed with OBGYN office yet. Pt reports emesis since 5:30am. Pt denies vaginal bleeding or leaking of fluids. This is pt's first pregnancy.

## 2023-02-22 ENCOUNTER — Telehealth: Payer: Self-pay | Admitting: Emergency Medicine

## 2023-02-22 MED ORDER — DIPHENHYDRAMINE HCL (SLEEP) 25 MG PO TBDP: 25.0000 mg | ORAL_TABLET | Freq: Every evening | ORAL | 0 refills | Status: DC | PRN

## 2023-02-22 MED ORDER — VITAMIN B-6 25 MG PO TABS: 25.0000 mg | ORAL_TABLET | Freq: Three times a day (TID) | ORAL | 1 refills | Status: AC | PRN

## 2023-02-22 NOTE — Telephone Encounter (Signed)
Switched from commendation medication to B6 and Unisom.

## 2023-04-14 ENCOUNTER — Other Ambulatory Visit: Payer: Self-pay

## 2023-04-14 ENCOUNTER — Emergency Department
Admission: EM | Admit: 2023-04-14 | Discharge: 2023-04-14 | Disposition: A | Discharge status: Home / Self Care | Payer: Medicaid Other | Attending: Emergency Medicine | Admitting: Emergency Medicine

## 2023-04-14 ENCOUNTER — Encounter: Payer: Self-pay | Admitting: Intensive Care

## 2023-04-14 DIAGNOSIS — O24111 Pre-existing diabetes mellitus, type 2, in pregnancy, first trimester: Secondary | ICD-10-CM | POA: Diagnosis not present

## 2023-04-14 DIAGNOSIS — E876 Hypokalemia: Secondary | ICD-10-CM | POA: Diagnosis not present

## 2023-04-14 DIAGNOSIS — Z3A13 13 weeks gestation of pregnancy: Secondary | ICD-10-CM | POA: Diagnosis not present

## 2023-04-14 DIAGNOSIS — O219 Vomiting of pregnancy, unspecified: Secondary | ICD-10-CM | POA: Diagnosis present

## 2023-04-14 LAB — HCG, QUANTITATIVE, PREGNANCY: hCG, Beta Chain, Quant, S: 47795 m[IU]/mL — ABNORMAL HIGH (ref <5)

## 2023-04-14 LAB — CBC
HCT: 36.4 % (ref 36.0–46.0)
Hemoglobin: 12.5 g/dL (ref 12.0–15.0)
MCH: 31.1 pg (ref 26.0–34.0)
MCHC: 34.3 g/dL (ref 30.0–36.0)
MCV: 90.5 fL (ref 80.0–100.0)
Platelets: 186 10*3/uL (ref 150–400)
RBC: 4.02 MIL/uL (ref 3.87–5.11)
RDW: 13.2 % (ref 11.5–15.5)
WBC: 9.7 10*3/uL (ref 4.0–10.5)
nRBC: 0 % (ref 0.0–0.2)

## 2023-04-14 LAB — URINALYSIS, ROUTINE W REFLEX MICROSCOPIC
Bilirubin Urine: NEGATIVE
Glucose, UA: NEGATIVE mg/dL
Hgb urine dipstick: NEGATIVE
Ketones, ur: 20 mg/dL — AB
Nitrite: NEGATIVE
Protein, ur: NEGATIVE mg/dL
Specific Gravity, Urine: 1.019 (ref 1.005–1.030)
pH: 6 (ref 5.0–8.0)

## 2023-04-14 LAB — COMPREHENSIVE METABOLIC PANEL
ALT: 14 U/L (ref 0–44)
AST: 20 U/L (ref 15–41)
Albumin: 3.6 g/dL (ref 3.5–5.0)
Alkaline Phosphatase: 36 U/L — ABNORMAL LOW (ref 38–126)
Anion gap: 8 (ref 5–15)
BUN: 6 mg/dL (ref 6–20)
CO2: 20 mmol/L — ABNORMAL LOW (ref 22–32)
Calcium: 9 mg/dL (ref 8.9–10.3)
Chloride: 106 mmol/L (ref 98–111)
Creatinine, Ser: 0.46 mg/dL (ref 0.44–1.00)
GFR, Estimated: >60 mL/min (ref >60)
Glucose, Bld: 90 mg/dL (ref 70–99)
Potassium: 3.4 mmol/L — ABNORMAL LOW (ref 3.5–5.1)
Sodium: 134 mmol/L — ABNORMAL LOW (ref 135–145)
Total Bilirubin: 0.6 mg/dL (ref 0.3–1.2)
Total Protein: 6.6 g/dL (ref 6.5–8.1)

## 2023-04-14 LAB — LIPASE, BLOOD: Lipase: 24 U/L (ref 11–51)

## 2023-04-14 MED ORDER — DOXYLAMINE-PYRIDOXINE 10-10 MG PO TBEC: 1.0000 | DELAYED_RELEASE_TABLET | Freq: Every day | ORAL | 0 refills | Status: DC

## 2023-04-14 MED ORDER — ONDANSETRON HCL 4 MG/2ML IJ SOLN
4.0000 mg | Freq: Once | INTRAMUSCULAR | Status: AC
  Administered 2023-04-14: 4 mg via INTRAVENOUS
  Filled 2023-04-14: qty 2

## 2023-04-14 MED ORDER — LACTATED RINGERS IV BOLUS
1000.0000 mL | Freq: Once | INTRAVENOUS | Status: AC
  Administered 2023-04-14: 1000 mL via INTRAVENOUS

## 2023-04-14 NOTE — ED Triage Notes (Signed)
Patient reports she is [redacted] weeks pregnant. Seen at [facility] for OBGYN care. Denies vaginal bleeding. C/o emesis and diarrhea. Denies pain

## 2023-04-14 NOTE — ED Provider Notes (Signed)
[facility] Provider Note    Event Date/Time   First MD Initiated Contact with Patient 04/14/23 1711     (approximate)   History   Chief Complaint Emesis During Pregnancy   HPI  Natasha Hayes is a 22 y.o. female G1P0 at approximately 13 weeks of pregnancy with past medical history of diabetes and PTSD who presents to the ED complaining of nausea and vomiting.  Patient reports that since waking up this morning she has had persistent nausea with multiple episodes of vomiting.  She has been unable to keep down either liquids or solids, does endorse some associated diarrhea.  She has not had any fevers, abdominal pain, dysuria, hematuria, or vaginal bleeding.  She does not have any nausea medication to take at home.  She currently follows with MFM at [facility] after baby was found to have cystic hygroma on ultrasound.     Physical Exam   Triage Vital Signs: ED Triage Vitals  Enc Vitals Group     BP 04/14/23 1429 (!) 126/95     Pulse Rate 04/14/23 1429 (!) 107     Resp 04/14/23 1429 16     Temp 04/14/23 1429 98.2 F (36.8 C)     Temp Source 04/14/23 1429 Oral     SpO2 04/14/23 1429 98 %     Weight 04/14/23 1432 185 lb (83.9 kg)     Height 04/14/23 1432  (1.6 m)     Head Circumference --      Peak Flow --      Pain Score 04/14/23 1432 0     Pain Loc --      Pain Edu? --      Excl. in [place]? --     Most recent vital signs: Vitals:   04/14/23 1429 04/14/23 1945  BP: (!) 126/95 132/77  Pulse: (!) 107 88  Resp: 16 16  Temp: 98.2 F (36.8 C) 98.1 F (36.7 C)  SpO2: 98% 100%    Constitutional: Alert and oriented. Eyes: Conjunctivae are normal. Head: Atraumatic. Nose: No congestion/rhinnorhea. Mouth/Throat: Mucous membranes are moist.  Cardiovascular: Normal rate, regular rhythm. Grossly normal heart sounds.  2+ radial pulses bilaterally. Respiratory: Normal respiratory effort.  No retractions. Lungs CTAB. Gastrointestinal: Soft and nontender. No  distention. Musculoskeletal: No lower extremity tenderness nor edema.  Neurologic:  Normal speech and language. No gross focal neurologic deficits are appreciated.    ED Results / Procedures / Treatments   Labs (all labs ordered are listed, but only abnormal results are displayed) Labs Reviewed  COMPREHENSIVE METABOLIC PANEL - Abnormal; Notable for the following components:      Result Value   Sodium 134 (*)    Potassium 3.4 (*)    CO2 20 (*)    Alkaline Phosphatase 36 (*)    All other components within normal limits  URINALYSIS, ROUTINE W REFLEX MICROSCOPIC - Abnormal; Notable for the following components:   Color, Urine YELLOW (*)    APPearance CLOUDY (*)    Ketones, ur 20 (*)    Leukocytes,Ua MODERATE (*)    Bacteria, UA RARE (*)    All other components within normal limits  HCG, QUANTITATIVE, PREGNANCY - Abnormal; Notable for the following components:   hCG, Beta Chain, Quant, S [id number] (*)    All other components within normal limits  LIPASE, BLOOD  CBC    PROCEDURES:  Critical Care performed: No  Procedures   MEDICATIONS ORDERED IN ED: Medications  ondansetron (ZOFRAN) injection  4 mg (4 mg Intravenous Given 04/14/23 1754)  lactated ringers bolus 1,000 mL (0 mLs Intravenous Stopped 04/14/23 1943)     IMPRESSION / MDM / ASSESSMENT AND PLAN / ED COURSE  I reviewed the triage vital signs and the nursing notes.                              22 y.o. female G1P0 at approximately 13 weeks of pregnancy with past medical history of PTSD and diabetes who presents to the ED complaining of persistent nausea and vomiting since getting up this morning associated with diarrhea.  Patient's presentation is most consistent with acute presentation with potential threat to life or bodily function.  Differential diagnosis includes, but is not limited to, hyperemesis gravidarum, UTI, electrolyte abnormality, AKI, dehydration.  Patient nontoxic-appearing and in no acute distress,  vital signs are remarkable for tachycardia but otherwise reassuring.  Patient has a benign abdominal exam, but continues to feel nauseous with ongoing vomiting at the time of my assessment.  Prior obstetric ultrasound reviewed and shows intrauterine pregnancy, do not feel repeat ultrasound is indicated at this time.  Labs are reassuring with no significant anemia, leukocytosis, electrolyte abnormality, or AKI.  LFTs and lipase are also unremarkable.  We will hydrate with IV fluids and treat symptomatically with IV Zofran.  Urinalysis does not appear concerning for infection.  Patient feeling better following Zofran, tolerating oral intake without difficulty.  She is appropriate for discharge home with OB/GYN follow-up at [facility], was counseled to return to the ED for new or worsening symptoms.  Patient agrees with plan.      FINAL CLINICAL IMPRESSION(S) / ED DIAGNOSES   Final diagnoses:  Nausea and vomiting in pregnancy     Rx / DC Orders   ED Discharge Orders          Ordered    Doxylamine-Pyridoxine (DICLEGIS) 10-10 MG TBEC  Daily at bedtime        04/14/23 1957             Note:  This document was prepared using Dragon voice recognition software and may include unintentional dictation errors.   Chesley Noon, MD 04/14/23 [phone]

## 2023-04-14 NOTE — ED Notes (Signed)
Patient states she drank approximately 500 ml water without nausea or vomiting.

## 2023-05-11 ENCOUNTER — Emergency Department: Payer: Medicaid Other

## 2023-05-11 ENCOUNTER — Emergency Department
Admission: EM | Admit: 2023-05-11 | Discharge: 2023-05-11 | Disposition: A | Discharge status: Home / Self Care | Payer: Medicaid Other | Attending: Emergency Medicine | Admitting: Emergency Medicine

## 2023-05-11 DIAGNOSIS — Z23 Encounter for immunization: Secondary | ICD-10-CM | POA: Insufficient documentation

## 2023-05-11 DIAGNOSIS — Z3A17 17 weeks gestation of pregnancy: Secondary | ICD-10-CM | POA: Diagnosis not present

## 2023-05-11 DIAGNOSIS — E119 Type 2 diabetes mellitus without complications: Secondary | ICD-10-CM | POA: Diagnosis not present

## 2023-05-11 DIAGNOSIS — Z794 Long term (current) use of insulin: Secondary | ICD-10-CM | POA: Insufficient documentation

## 2023-05-11 DIAGNOSIS — O0992 Supervision of high risk pregnancy, unspecified, second trimester: Secondary | ICD-10-CM | POA: Diagnosis not present

## 2023-05-11 DIAGNOSIS — S93402A Sprain of unspecified ligament of left ankle, initial encounter: Secondary | ICD-10-CM

## 2023-05-11 DIAGNOSIS — O9A212 Injury, poisoning and certain other consequences of external causes complicating pregnancy, second trimester: Secondary | ICD-10-CM | POA: Diagnosis present

## 2023-05-11 DIAGNOSIS — Y92410 Unspecified street and highway as the place of occurrence of the external cause: Secondary | ICD-10-CM | POA: Diagnosis not present

## 2023-05-11 LAB — VISTARA: REPORT SUMMARY: NEGATIVE

## 2023-05-11 MED ORDER — TETANUS-DIPHTH-ACELL PERTUSSIS 5-2.5-18.5 LF-MCG/0.5 IM SUSY
0.5000 mL | PREFILLED_SYRINGE | Freq: Once | INTRAMUSCULAR | Status: AC
  Administered 2023-05-11: 0.5 mL via INTRAMUSCULAR
  Filled 2023-05-11: qty 0.5

## 2023-05-11 MED ORDER — ACETAMINOPHEN 500 MG PO TABS
1000.0000 mg | ORAL_TABLET | Freq: Once | ORAL | Status: AC
  Administered 2023-05-11: 1000 mg via ORAL
  Filled 2023-05-11: qty 2
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
## 2023-05-11 NOTE — ED Notes (Signed)
Fetal heart tones 158

## 2023-05-11 NOTE — Discharge Instructions (Addendum)
You should stay off of it and to follow-up with orthopedics and return to the ER if you develop worsening symptoms or any other concerns.  Try ice and keep it elevated.  Take Tylenol 1 g every 8 hours.  Try to take off the cast nightly and do some slight movements of it to try to keep it from getting too stiff.  Return to the ER if you develop worsening symptoms or any other concerns
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
## 2023-06-09 ENCOUNTER — Other Ambulatory Visit: Payer: Self-pay

## 2023-06-09 ENCOUNTER — Other Ambulatory Visit (HOSPITAL_COMMUNITY): Payer: Self-pay

## 2023-06-09 ENCOUNTER — Ambulatory Visit: Payer: Medicaid Other

## 2023-06-09 ENCOUNTER — Telehealth (HOSPITAL_COMMUNITY): Payer: Self-pay | Admitting: Pharmacy Technician

## 2023-06-09 ENCOUNTER — Inpatient Hospital Stay
Admission: EM | Admit: 2023-06-09 | Discharge: 2023-06-09 | Disposition: A | Discharge status: Home / Self Care | Payer: Medicaid Other

## 2023-06-09 DIAGNOSIS — O99332 Smoking (tobacco) complicating pregnancy, second trimester: Secondary | ICD-10-CM | POA: Diagnosis not present

## 2023-06-09 DIAGNOSIS — F17210 Nicotine dependence, cigarettes, uncomplicated: Secondary | ICD-10-CM | POA: Insufficient documentation

## 2023-06-09 DIAGNOSIS — Z3A21 21 weeks gestation of pregnancy: Secondary | ICD-10-CM | POA: Diagnosis not present

## 2023-06-09 DIAGNOSIS — O24414 Gestational diabetes mellitus in pregnancy, insulin controlled: Secondary | ICD-10-CM | POA: Diagnosis not present

## 2023-06-09 DIAGNOSIS — E162 Hypoglycemia, unspecified: Secondary | ICD-10-CM

## 2023-06-09 DIAGNOSIS — Z5986 Financial insecurity: Secondary | ICD-10-CM | POA: Diagnosis not present

## 2023-06-09 DIAGNOSIS — O99212 Obesity complicating pregnancy, second trimester: Secondary | ICD-10-CM | POA: Diagnosis not present

## 2023-06-09 DIAGNOSIS — R569 Unspecified convulsions: Secondary | ICD-10-CM | POA: Diagnosis not present

## 2023-06-09 DIAGNOSIS — O26892 Other specified pregnancy related conditions, second trimester: Secondary | ICD-10-CM | POA: Diagnosis present

## 2023-06-09 DIAGNOSIS — Z5982 Transportation insecurity: Secondary | ICD-10-CM | POA: Insufficient documentation

## 2023-06-09 DIAGNOSIS — O99891 Other specified diseases and conditions complicating pregnancy: Secondary | ICD-10-CM | POA: Diagnosis not present

## 2023-06-09 LAB — COMPREHENSIVE METABOLIC PANEL
ALT: 13 U/L (ref 0–44)
AST: 18 U/L (ref 15–41)
Albumin: 3.8 g/dL (ref 3.5–5.0)
Alkaline Phosphatase: 45 U/L (ref 38–126)
Anion gap: 11 (ref 5–15)
BUN: 6 mg/dL (ref 6–20)
CO2: 20 mmol/L — ABNORMAL LOW (ref 22–32)
Calcium: 8.6 mg/dL — ABNORMAL LOW (ref 8.9–10.3)
Chloride: 106 mmol/L (ref 98–111)
Creatinine, Ser: 0.49 mg/dL (ref 0.44–1.00)
GFR, Estimated: >60 mL/min (ref >60)
Glucose, Bld: 101 mg/dL — ABNORMAL HIGH (ref 70–99)
Potassium: 3.1 mmol/L — ABNORMAL LOW (ref 3.5–5.1)
Sodium: 137 mmol/L (ref 135–145)
Total Bilirubin: 0.4 mg/dL (ref 0.3–1.2)
Total Protein: 6.4 g/dL — ABNORMAL LOW (ref 6.5–8.1)

## 2023-06-09 LAB — URINALYSIS, W/ REFLEX TO CULTURE (INFECTION SUSPECTED)
Bilirubin Urine: NEGATIVE
Glucose, UA: NEGATIVE mg/dL
Hgb urine dipstick: NEGATIVE
Ketones, ur: NEGATIVE mg/dL
Leukocytes,Ua: NEGATIVE
Nitrite: NEGATIVE
Protein, ur: NEGATIVE mg/dL
Specific Gravity, Urine: 1.009 (ref 1.005–1.030)
pH: 7 (ref 5.0–8.0)

## 2023-06-09 LAB — CBG MONITORING, ED
Glucose-Capillary: 93 mg/dL (ref 70–99)
Glucose-Capillary: 96 mg/dL (ref 70–99)

## 2023-06-09 LAB — URINE DRUG SCREEN, QUALITATIVE (ARMC ONLY)
Amphetamines, Ur Screen: NOT DETECTED
Barbiturates, Ur Screen: NOT DETECTED
Benzodiazepine, Ur Scrn: NOT DETECTED
Cannabinoid 50 Ng, Ur ~~LOC~~: NOT DETECTED
Cocaine Metabolite,Ur ~~LOC~~: NOT DETECTED
MDMA (Ecstasy)Ur Screen: NOT DETECTED
Methadone Scn, Ur: NOT DETECTED
Opiate, Ur Screen: NOT DETECTED
Phencyclidine (PCP) Ur S: NOT DETECTED
Tricyclic, Ur Screen: NOT DETECTED

## 2023-06-09 LAB — GLUCOSE, CAPILLARY
Glucose-Capillary: 92 mg/dL (ref 70–99)
Glucose-Capillary: 107 mg/dL — ABNORMAL HIGH (ref 70–99)

## 2023-06-09 LAB — CBC
HCT: 34.9 % — ABNORMAL LOW (ref 36.0–46.0)
Hemoglobin: 12 g/dL (ref 12.0–15.0)
MCH: 31.5 pg (ref 26.0–34.0)
MCHC: 34.4 g/dL (ref 30.0–36.0)
MCV: 91.6 fL (ref 80.0–100.0)
Platelets: 172 10*3/uL (ref 150–400)
RBC: 3.81 MIL/uL — ABNORMAL LOW (ref 3.87–5.11)
RDW: 13.2 % (ref 11.5–15.5)
WBC: 10.4 10*3/uL (ref 4.0–10.5)
nRBC: 0 % (ref 0.0–0.2)

## 2023-06-09 LAB — PROTEIN / CREATININE RATIO, URINE
Creatinine, Urine: 71 mg/dL
Total Protein, Urine: <6 mg/dL

## 2023-06-09 LAB — MAGNESIUM: Magnesium: 1.9 mg/dL (ref 1.7–2.4)

## 2023-06-09 LAB — TROPONIN I (HIGH SENSITIVITY)
Troponin I (High Sensitivity): 2 ng/L (ref <18)
Troponin I (High Sensitivity): 2 ng/L (ref <18)

## 2023-06-09 LAB — C-REACTIVE PROTEIN: CRP: 0.7 mg/dL (ref <1.0)

## 2023-06-09 LAB — TSH: TSH: 1.53 u[IU]/mL (ref 0.350–4.500)

## 2023-06-09 LAB — LACTIC ACID, PLASMA: Lactic Acid, Venous: 1.5 mmol/L (ref 0.5–1.9)

## 2023-06-09 LAB — SEDIMENTATION RATE: Sed Rate: 10 mm/hr (ref 0–20)

## 2023-06-09 LAB — HCG, QUANTITATIVE, PREGNANCY: hCG, Beta Chain, Quant, S: 19398 m[IU]/mL — ABNORMAL HIGH (ref <5)

## 2023-06-09 MED ORDER — FREESTYLE LIBRE 3 SENSOR MISC: 1.0000 | 1 refills | Status: AC

## 2023-06-09 MED ORDER — PRENATAL MULTIVITAMIN CH: 1.0000 | ORAL_TABLET | Freq: Every day | ORAL | Status: DC

## 2023-06-09 MED ORDER — POTASSIUM CHLORIDE CRYS ER 20 MEQ PO TBCR
40.0000 meq | EXTENDED_RELEASE_TABLET | Freq: Once | ORAL | Status: AC
  Administered 2023-06-09: 40 meq via ORAL
  Filled 2023-06-09: qty 2

## 2023-06-09 MED ORDER — ZOLPIDEM TARTRATE 5 MG PO TABS: 5.0000 mg | ORAL_TABLET | Freq: Every evening | ORAL | Status: DC | PRN

## 2023-06-09 MED ORDER — DOCUSATE SODIUM 100 MG PO CAPS: 100.0000 mg | ORAL_CAPSULE | Freq: Every day | ORAL | Status: DC

## 2023-06-09 MED ORDER — CALCIUM CARBONATE ANTACID 500 MG PO CHEW: 2.0000 | CHEWABLE_TABLET | ORAL | Status: DC | PRN

## 2023-06-09 MED ORDER — BAQSIMI TWO PACK 3 MG/DOSE NA POWD: 1.0000 | NASAL | 0 refills | Status: AC

## 2023-06-09 MED ORDER — DIAZEPAM 5 MG/ML IJ SOLN: 2.5000 mg | INTRAMUSCULAR | Status: DC | PRN

## 2023-06-09 MED ORDER — LACTATED RINGERS IV SOLN: 125.0000 mL/h | INTRAVENOUS | Status: DC

## 2023-06-09 MED ORDER — ACETAMINOPHEN 325 MG PO TABS
650.0000 mg | ORAL_TABLET | ORAL | Status: DC | PRN
  Administered 2023-06-09: 650 mg via ORAL
  Filled 2023-06-09: qty 2

## 2023-06-09 NOTE — Progress Notes (Signed)
RN at bedside- pt expressed her newly placed sensor was not working. RN verified- sensor not reading, sensor requesting replacement. Pt pulled off sensor. Pt put on new sensor. Site began to bleed, RN held pressure. CNM notified of situation and diabetes coordinator paged.

## 2023-06-09 NOTE — Discharge Summary (Signed)
Patient ID: Natasha Hayes MRN: [id number] DOB/AGE: Nov 28, 2001 22 y.o.  Admit date: 06/09/2023 Discharge date: 06/09/2023  Admission Diagnoses: Hypoglycemia [E16.2] Seizure-like activity (HCC) [R56.9] Witnessed seizure-like activity (HCC) [R56.9]  Discharge Diagnoses: Same   Prenatal Care Site:  [facility]   Prenatal Procedures: EEG  Consults: Neonatology, Maternal Fetal Medicine  Significant Diagnostic Studies:  Results for orders placed or performed during the hospital encounter of 06/09/23 (from the past 168 hour(s))  Protein / creatinine ratio, urine   Collection Time: 06/09/23  1:29 AM  Result Value Ref Range   Creatinine, Urine 71 mg/dL   Total Protein, Urine <6 mg/dL   Protein Creatinine Ratio        0.00 - 0.15 mg/mg[Cre]  Urinalysis, w/ Reflex to Culture (Infection Suspected) -Urine, Clean Catch   Collection Time: 06/09/23  1:29 AM  Result Value Ref Range   Specimen Source URINE, CLEAN CATCH    Color, Urine YELLOW (A) YELLOW   APPearance CLEAR (A) CLEAR   Specific Gravity, Urine 1.009 1.005 - 1.030   pH 7.0 5.0 - 8.0   Glucose, UA NEGATIVE NEGATIVE mg/dL   Hgb urine dipstick NEGATIVE NEGATIVE   Bilirubin Urine NEGATIVE NEGATIVE   Ketones, ur NEGATIVE NEGATIVE mg/dL   Protein, ur NEGATIVE NEGATIVE mg/dL   Nitrite NEGATIVE NEGATIVE   Leukocytes,Ua NEGATIVE NEGATIVE   RBC / HPF 0-5 0 - 5 RBC/hpf   WBC, UA 0-5 0 - 5 WBC/hpf   Bacteria, UA RARE (A) NONE SEEN   Squamous Epithelial / HPF 6-10 0 - 5 /HPF   Mucus PRESENT   Urine Drug Screen, Qualitative ([facility] only)   Collection Time: 06/09/23  1:29 AM  Result Value Ref Range   Tricyclic, Ur Screen NONE DETECTED NONE DETECTED   Amphetamines, Ur Screen NONE DETECTED NONE DETECTED   MDMA (Ecstasy)Ur Screen NONE DETECTED NONE DETECTED   Cocaine Metabolite,Ur [place] NONE DETECTED NONE DETECTED   Opiate, Ur Screen NONE DETECTED NONE DETECTED   Phencyclidine (PCP) Ur S NONE DETECTED NONE DETECTED   Cannabinoid 50 Ng, Ur [place] NONE  DETECTED NONE DETECTED   Barbiturates, Ur Screen NONE DETECTED NONE DETECTED   Benzodiazepine, Ur Scrn NONE DETECTED NONE DETECTED   Methadone Scn, Ur NONE DETECTED NONE DETECTED  CBC   Collection Time: 06/09/23  1:30 AM  Result Value Ref Range   WBC 10.4 4.0 - 10.5 K/uL   RBC Hayes.81 (L) Hayes.87 - 5.11 MIL/uL   Hemoglobin 12.0 12.0 - 15.0 g/dL   HCT 40.9 (L) 81.1 - 91.4 %   MCV 91.6 80.0 - 100.0 fL   MCH 31.5 26.0 - 34.0 pg   MCHC 34.4 30.0 - 36.0 g/dL   RDW 78.2 95.6 - 21.Hayes %   Platelets 172 150 - 400 K/uL   nRBC 0.0 0.0 - 0.2 %  Comprehensive metabolic panel   Collection Time: 06/09/23  1:30 AM  Result Value Ref Range   Sodium 137 135 - 145 mmol/L   Potassium Hayes.1 (L) Hayes.5 - 5.1 mmol/L   Chloride 106 98 - 111 mmol/L   CO2 20 (L) 22 - 32 mmol/L   Glucose, Bld 101 (H) 70 - 99 mg/dL   BUN 6 6 - 20 mg/dL   Creatinine, Ser 0.86 0.44 - 1.00 mg/dL   Calcium 8.6 (L) 8.9 - 10.Hayes mg/dL   Total Protein 6.4 (L) 6.5 - 8.1 g/dL   Albumin Hayes.8 Hayes.5 - 5.0 g/dL   AST 18 15 - 41 U/L   ALT 13  0 - 44 U/L   Alkaline Phosphatase 45 38 - 126 U/L   Total Bilirubin 0.4 0.Hayes - 1.2 mg/dL   GFR, Estimated >78 >29 mL/min   Anion gap 11 5 - 15  Magnesium   Collection Time: 06/09/23  1:30 AM  Result Value Ref Range   Magnesium 1.9 1.7 - 2.4 mg/dL  hCG, quantitative, pregnancy   Collection Time: 06/09/23  1:30 AM  Result Value Ref Range   hCG, Beta Chain, Quant, S 19,398 (H) <5 mIU/mL  Troponin I (High Sensitivity)   Collection Time: 06/09/23  1:30 AM  Result Value Ref Range   Troponin I (High Sensitivity) 2 <18 ng/L  CBG monitoring, ED   Collection Time: 06/09/23  1:31 AM  Result Value Ref Range   Glucose-Capillary 93 70 - 99 mg/dL  CBG monitoring, ED   Collection Time: 06/09/23  Hayes:29 AM  Result Value Ref Range   Glucose-Capillary 96 70 - 99 mg/dL  Glucose, capillary   Collection Time: 06/09/23  4:56 AM  Result Value Ref Range   Glucose-Capillary 92 70 - 99 mg/dL  Troponin I (High Sensitivity)    Collection Time: 06/09/23  5:47 AM  Result Value Ref Range   Troponin I (High Sensitivity) 2 <18 ng/L  Lactic acid, plasma   Collection Time: 06/09/23  8:06 AM  Result Value Ref Range   Lactic Acid, Venous 1.5 0.5 - 1.9 mmol/L  C-reactive protein   Collection Time: 06/09/23  8:06 AM  Result Value Ref Range   CRP 0.7 <1.0 mg/dL  Sedimentation rate   Collection Time: 06/09/23  8:06 AM  Result Value Ref Range   Sed Rate 10 0 - 20 mm/hr  TSH   Collection Time: 06/09/23  8:06 AM  Result Value Ref Range   TSH 1.530 0.350 - 4.500 uIU/mL  Glucose, capillary   Collection Time: 06/09/23 11:45 AM  Result Value Ref Range   Glucose-Capillary 107 (H) 70 - 99 mg/dL    Treatments: procedures: EEG  Hospital Course:  This is a 22 y.o. G1P0000 with IUP at [redacted]w[redacted]d observed after witnessed seizure-like activity at home. She was brought to the ED via EMS after she had a loss of consciousness episode and several seizure-like activity events that were 30 seconds to a minute long.  She did not have loss of bowel or bladder control.  She had slight confusion immediately following the episodes but returned quickly to her mental status baseline by the time EMS arrived. Prior to the episodes she had taken her nightly dose of NPH insulin.  She reports taking 10 units and had a blood sugar of 116.  She was evaluated by neurology and had an EEG done which was overall normal.  Episodes were likely related to hypoglycemic event and did not recommend AED's at this time.  Diabetes coordinator consult was also completed for assistance with diabetes education and management. Recommend using CGM so that she can more readily assess her blood sugar and better detect both low and high blood sugars.  She was given a sensor for Natasha Hayes monitor.  Pre-authorization was completed during her stay and a prescription was sent to her pharmacy.  Her blood sugars remained stable during her observation and she had no further episodes.  She was  deemed stable for discharge to home with outpatient follow up.  Discharge Physical Exam:  BP 114/70 (BP Location: Left Arm)   Pulse 89   Temp 97.9 F (36.6 C) (Oral)   Resp  18   Ht 5\' Hayes"  (1.6 m)   Wt 93.2 kg   LMP 01/10/2023 (Exact Date)   SpO2 99%   BMI 36.40 kg/m   General: NAD CV: RRR Pulm: CTABL, nl effort ABD: s/nd/nt, gravid DVT Evaluation: LE non-ttp, no evidence of DVT on exam. SVE: deferred  Doppler: 140 bpm, distinquished from maternal pulse   Discharge Condition: Stable  Disposition: Discharge disposition: 01-Home or Self Care       Allergies as of 06/09/2023   No Known Allergies      Medication List     STOP taking these medications    diphenhydrAMINE HCl (Sleep) 25 MG Tbdp   Doxylamine-Pyridoxine 10-10 MG Tbec Commonly known as: Diclegis   pantoprazole 40 MG tablet Commonly known as: PROTONIX   Semaglutide (1 MG/DOSE) 4 MG/3ML Sopn   sertraline 50 MG tablet Commonly known as: ZOLOFT   traZODone 100 MG tablet Commonly known as: DESYREL       TAKE these medications    Baqsimi Two Pack Hayes MG/DOSE Powd Generic drug: Glucagon Place 1 spray into the nose as directed. For severe hypoglycemia.   FreeStyle Libre Hayes Sensor Misc 1 Application by Does not apply route as directed. Place 1 sensor on the skin every 14 days. Use to check glucose continuously   insulin NPH Human 100 UNIT/ML injection Commonly known as: NOVOLIN N Inject 10 Units into the skin at bedtime. Take daily at bedtime (9-10pm)   PRENATAL ADULT GUMMY/DHA/FA PO Take 2 tablets by mouth daily at 12 noon.        Follow-up Information     Darrin Luis. Go in 2 day(s).   Why: for scheduled prenatal appointment and ultrasound Contact information: [id number] [place] [address] [phone]                 Signed:  Gustavo Lah 06/09/2023 5:10 PM ----- Margaretmary Eddy, CNM Certified Nurse Midwife [facility]

## 2023-06-09 NOTE — Progress Notes (Signed)
Eeg done 
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
## 2023-06-09 NOTE — ED Provider Notes (Signed)
[facility] Provider Note    Event Date/Time   First MD Initiated Contact with Patient 06/09/23 0129     (approximate)   History   Seizures   HPI  Natasha Hayes is a 22 y.o. female G1, P0 at approximately 6 months gestational age presents to the emergency department following seizure-like activity.  Patient has questionable gestational diabetes versus diabetes and has been using insulin.  10 units of insulin tonight, she checked her sugar just before hand and was noted to be 116 but was told to always take her insulin no matter what.  Insulin was increased to 20 units but states that she only took 10 tonight.  Afterwards started to not feel well like she might pass out was falling forward, laid in the bed and then her husband stated that she had seizure-like activity.  Lasted for less than a minute and then she returned back to normal.  No tongue biting or incontinence.  No headache or head injury.  Denies any chest pain or shortness of breath.  No abdominal pain.  No leakage of fluid.  No vaginal bleeding.  Good fetal movement and normal day prior to this.  No recent viral illnesses.  No history of seizures.  Glucose with EMS was 77.  When boyfriend arrived stated that the patient actually had 3 seizures that were witnessed.  Initial seizure-like activity with eye deviation to the right with generalized shaking.  No tongue biting or incontinence.  Lasted for approximately 30 seconds.  Started to come back to consciousness and then had a second witnessed seizure while he was on the phone with EMS states that it appeared similar to the first 1 lasted about the same amount of time.  Then had a third episode immediately following that lasted less than 20 seconds and then resolved on its own.  Slight confusion immediately following.  Back to her mental status baseline by the time EMS arrived.     Physical Exam   Triage Vital Signs: ED Triage Vitals  Enc Vitals Group      BP      Pulse      Resp      Temp      Temp src      SpO2      Weight      Height      Head Circumference      Peak Flow      Pain Score      Pain Loc      Pain Edu?      Excl. in GC?     Most recent vital signs: Vitals:   06/09/23 0131 06/09/23 0135  BP: 118/75   Pulse: 93   Resp: 20   Temp:  98.7 F (37.1 C)  SpO2: 99%     Physical Exam Constitutional:      Appearance: She is well-developed.  HENT:     Head: Atraumatic.  Eyes:     Conjunctiva/sclera: Conjunctivae normal.  Cardiovascular:     Rate and Rhythm: Regular rhythm.  Pulmonary:     Effort: No respiratory distress.  Abdominal:     General: There is no distension.     Tenderness: There is no abdominal tenderness.     Comments: Gravid uterus  Musculoskeletal:        General: Normal range of motion.     Cervical back: Normal range of motion.  Skin:    General: Skin is warm.  Neurological:  Mental Status: She is alert. Mental status is at baseline.     GCS: GCS eye subscore is 4. GCS verbal subscore is 5. GCS motor subscore is 6.     Cranial Nerves: Cranial nerves 2-12 are intact.     Sensory: Sensation is intact.     Motor: Motor function is intact.     Coordination: Coordination is intact.     IMPRESSION / MDM / ASSESSMENT AND PLAN / ED COURSE  I reviewed the triage vital signs and the nursing notes.  Blood pressure 118/75.  Differential diagnosis including seizure, dysrhythmia, hypoglycemia, electrolyte abnormality, dehydration, eclampsia  EKG  I, Corena Herter, the attending physician, personally viewed and interpreted this ECG.   Rate: Normal  Rhythm: Normal sinus  Axis: Normal  Intervals: Normal  ST&T Change: None  No tachycardic or bradycardic dysrhythmias while on cardiac telemetry.   LABS (all labs ordered are listed, but only abnormal results are displayed) Labs interpreted as -    Labs Reviewed  CBC - Abnormal; Notable for the following components:      Result  Value   RBC 3.81 (*)    HCT 34.9 (*)    All other components within normal limits  COMPREHENSIVE METABOLIC PANEL - Abnormal; Notable for the following components:   Potassium 3.1 (*)    CO2 20 (*)    Glucose, Bld 101 (*)    Calcium 8.6 (*)    Total Protein 6.4 (*)    All other components within normal limits  HCG, QUANTITATIVE, PREGNANCY - Abnormal; Notable for the following components:   hCG, Beta Chain, Quant, S 19,398 (*)    All other components within normal limits  MAGNESIUM  PROTEIN / CREATININE RATIO, URINE  URINALYSIS, W/ REFLEX TO CULTURE (INFECTION SUSPECTED)  CBG MONITORING, ED  CBG MONITORING, ED  TROPONIN I (HIGH SENSITIVITY)  TROPONIN I (HIGH SENSITIVITY)     MDM  Glucose within normal limits.  Mild hypokalemia at 3.1, given 40 mill equivalents p.o. potassium repletion.  Magnesium level within normal limits.  No other significant electrolyte abnormalities.  No significant anemia.  Urine currently pending.  Patient has normal blood pressure while in the emergency department.  Bedside ultrasound with good fetal movement and fetal heart rate.  Consulted and discussed with obstetrician on-call Dr. Feliberto Gottron, recommended admission for further workup and evaluation for preeclampsia.  Recommended urine protein/creatinine ratio.  Consulted teleneurology given 3 witnessed questionable seizure-like activity. Discussed with Dr. Olen Pel, felt like this was most likely secondary to hypoglycemia and less likely seizure-like activity.  Recommended not starting on an AED at this time.  Did recommend spot EEG in the a.m. since being admitted to obstetrician and if no concern for seizure-like activity would not start on an AED, if did have seizure-like activity would consider Keppra.  Discussed with Donato Schultz, admitted to obstetrician     PROCEDURES:  Critical Care performed: No  Ultrasound ED OB Pelvic  Date/Time: 06/09/2023 4:05 AM  Performed by: Corena Herter,  MD Authorized by: Corena Herter, MD   Procedure details:    Indications: pregnant with abdominal pain and pregnant with pelvic pain     Assess:  Fetal viability, EGA and intrauterine pregnancy   Technique:  Transabdominal obstetric (HCG+) exam   Images: not archived    Uterine findings:    Intrauterine pregnancy: identified     Single gestation: identified     Fetal heart rate: identified     Estimated gestational age: 54 weeks Other findings:  Free pelvic fluid: not identified     Free peritoneal fluid: not identified     Patient's presentation is most consistent with acute presentation with potential threat to life or bodily function.   MEDICATIONS ORDERED IN ED: Medications  lactated ringers infusion (has no administration in time range)  acetaminophen (TYLENOL) tablet 650 mg (has no administration in time range)  zolpidem (AMBIEN) tablet 5 mg (has no administration in time range)  docusate sodium (COLACE) capsule 100 mg (has no administration in time range)  calcium carbonate (TUMS - dosed in mg elemental calcium) chewable tablet 400 mg of elemental calcium (has no administration in time range)  prenatal multivitamin tablet 1 tablet (has no administration in time range)  diazepam (VALIUM) injection 2.5 mg (has no administration in time range)  potassium chloride SA (KLOR-CON M) CR tablet 40 mEq (40 mEq Oral Given 06/09/23 0345)    FINAL CLINICAL IMPRESSION(S) / ED DIAGNOSES   Final diagnoses:  Seizure-like activity (HCC)  Hypoglycemia     Rx / DC Orders   ED Discharge Orders     None        Note:  This document was prepared using Dragon voice recognition software and may include unintentional dictation errors.   Corena Herter, MD 06/09/23 [phone]

## 2023-06-09 NOTE — OB Triage Note (Signed)
Patient is [redacted]w[redacted]d is G1P0 primarily seen at [facility]. Came into ER for hypoglycemia and seizure like activity. Patient seen by tele neurologist. After being seen in the ER patient recommended to be seen by OB before being discharged. Patient denies any OB complaints today. States baby is kicking and is very active. Denies urinary symptoms, denies any discharge, has had sex recently in the past two days. FHR via doppler is 134.

## 2023-06-09 NOTE — Progress Notes (Signed)
69 - Stroke Cart 1 Activated for STAT Neuro Consult 0347 - STAT Neuro Consult paged

## 2023-06-09 NOTE — Discharge Instructions (Signed)
Please follow up with your primary OB provider this week to further review you blood sugars and insulin dosages.  If you feel unstable or unsafe please make arrangement for a family member or friend to provide support at home.

## 2023-06-09 NOTE — Inpatient Diabetes Management (Signed)
Inpatient Diabetes Program Recommendations  Diabetes Treatment Program Recommendations  ADA Standards of Care Diabetes in Pregnancy Target Glucose Ranges:  Fasting: 70 - 95 mg/dL 1 hr postprandial: Less than 140mg /dL (from first bite of meal) 2 hr postprandial: Less than 120 mg/dL (from first bite of meal)     Latest Reference Range & Units 06/09/23 01:31 06/09/23 03:29 06/09/23 04:56  Glucose-Capillary 70 - 99 mg/dL 93 96 92     Review of Glycemic Control  Diabetes history:DM2 hx; [redacted]W[redacted]D Outpatient Diabetes medications: NPH 10 units QHS, (prescribed Metformin 500 mg daily prior to pregnancy but did not tolerate due to GI intolerance; also prescribed Ozempic but took once and did not tolerate due to GI intolerance) Current orders for Inpatient glycemic control: CBGs Q4H  Inpatient Diabetes Program Recommendations:    Outpatient: Please provide Rx for FreeStyle Libre3 sensors (#[id number]) and Baqsimi (#[id number]).  NOTE: Patient has DM2 hx and noted to be [redacted]W[redacted]D gestation. Per chart review, patient had witnessed seizure-like activity last night around 23:30. Per H&P patient reported she did not eat dinner but took usual insulin dosage. Noted in Care Everywhere that patient had been prescribed Metformin 500 mg daily for DM2 prior to pregnancy. Patient was prescribed NPH 10 units on 04/29/23; when seen on 05/07/23 by Darrin Luis, [facility] it was noted she had not started NPH due to cost and patient was not checking glucose every day due to bruising on fingers. Per telemedicine note on 05/21/23, patient was taking NPH 10 units QHS and was asked to increase to NPH 16 units QHS. May need to consider using a continuous glucose monitoring sensor which would alarm to alert patient if hypoglycemia is noted; it would also be beneficial for her providers to use to gain more information on glycemic control which would help make adjustments with DM medications. Sent chat message to Margaretmary Eddy, CNM and she is  agreeable with talking with patient about using FreeStyle Libre3 CGM and assist to apply if patient is willing to use it.   Spoke with patient about DM control, hypoglycemia, and FreeStyle Libre3 CGM sensors. Patient confirms that she has been prescribed Metformin and Ozempic in the past. She states she took Metformin for about 1 week and had to stop due to diarrhea. She also states that she was prescribed Ozempic last year and she took 1 dose and was very sick with N/V and she did not take any other doses after that. Patient reports that she was started on NPH 10 units QHS in May and she has been taking NPH 10 units QHS and checking glucose fasting and after meals. Patient reports that she took NPH 10 units about 10:30 pm last night and EMS found patient's glucose to be 70 mg/dl. Patient has glucose values on her phone (in note) which range from 80's-120 mg/dl. Inquired if she increased the NPH dose at all as directed by OB and she states that she did NOT increase the dose of NPH due to fear of going too low. Discussed glucose goals during pregnancy (fasting less than 90 mg/dl and 2 hour post prandial less than 120 mg/dl). Discussed that during pregnancy, insulin resistance increases due to placental hormones and the pancreas has to make more insulin to overcome the resistance. Explained that as her pregnancy continues, she will likely need weekly adjustments with insulin and she will most likely continue to need more insulin. Discussed importance of glycemic control during pregnancy to prevent complications for her and her baby. Discussed  hypoglycemia along with treatment. Discussed Baqsimi (nasal glucagon) to have on hand and use to treat hypoglycemia if she is unable to treat orally. Encouraged patient to be sure to reach out to [facility] if she has any issues at all with hypoglycemia so they can make adjustments with insulin if needed. Discussed FreeStyle Libre3 CGM and patient states she had been asking for it but  she was told her insurance would not cover it. Educated patient on FreeStyle Libre3 CGM regarding application and changing CGM sensor (alternate every 14 days on back of arms), 1 hour warm-up, how to scan sensor to start a new sensor, and how to use app to check glucose.  Had patient watch YouTube video on how to apply FreeStyle Libre3 sensor.  Patient has been given Freestyle Libre3 sensor samples (2).  Patient applied FreeStyle Libre3 sensor to back of right arm. Provided educational packet regarding FreeStyle Libre3 CGM.  Patient plans to use android FreeStyle Libre3 app to read FreeStyle Libre3 sensor and was able to download the app and create an account. Informed patient that it would be requested that attending provider provide Rx for first month of FreeStyle Libre3 sensors and that she have OB continue to provide Rx for FreeStyle Libre3 sensors going forward. Asked patient to be sure to let PCP know about Radene Journey and allow provider to review reports from [place] app so the provider can use the information to continue to make adjustments with DM medications if needed. Patient verbalized understanding of information and has no questions at this time.   Thanks, Orlando Penner, RN, MSN, CDCES Diabetes Coordinator Inpatient Diabetes Program [phone] (Team Pager from 8am to 5pm)

## 2023-06-09 NOTE — Telephone Encounter (Signed)
Patient Advocate Encounter  Prior Authorization for  [employer] 3 Sensor  has been approved.    PA# [id number] Confirmation #:[id number] W Insurance [place] Medicaid Effective dates: 06/09/2023 through 12/06/2023      Roland Earl, CPhT Pharmacy Patient Advocate Specialist [facility] Patient Advocate Team Direct Number: [phone]  Fax: [phone]

## 2023-06-09 NOTE — Consult Note (Signed)
[facility] TeleNeurology Consult Services  Stat Consult  Patient Name:   Natasha Hayes, Natasha Hayes Date of Birth:   [birth year] Identification Number:   MRN - [id number] Date of Service:   06/09/2023 03:44:33  Diagnosis:       G40.9 - Epilepsy, unspecified  Impression 1. Seizure like activity (3 witnessed seizures): - Differential diagnosis: epileptic seizure vs. hypoglycemic convulsions vs. non-epileptic seizures - Plan: a. Obtain lab work for provoking factors: CBC, CMP, UA, Utox, lactic acid, TSH, ESR, CRP b. Consider routine EEG to evaluate for abnormal epileptiform activity c. Defer antiepileptic drug (AED) initiation at this point, given first-time seizure activity d. Monitor for any further seizure activity and reassess the need for AED initiation if necessary  2. 21-week gestational pregnancy: - Plan: a. Continue prenatal care and monitoring b. Assess for any complications such as eclampsia or preeclampsia during routine prenatal visits  3. Gestational diabetes: - Plan: a. Continue insulin therapy as prescribed b. Monitor blood glucose levels closely to prevent hypoglycemia and potential convulsions c. Educate the patient on the importance of blood glucose control during pregnancy and potential complications  4. First-time seizure workup: - Plan: a. Review results of lab work and EEG to determine the cause of the seizure activity b. Consult with a neurologist for further evaluation and management if necessary c. Educate the patient on seizure precautions and when to seek medical attention for seizure activity   Recommendations: Our recommendations are outlined below.   ----------------------------------------------------------------------------------------------------   Advanced Imaging: Advanced Imaging Deferred because:  Non-disabling symptoms as verified by the patient; no cortical signs so not consistent with LVO    Metrics: [facility]  Notification Time: 06/09/2023 03:42:23 Stamp Time: 06/09/2023 03:44:33 Callback Response Time: 06/09/2023 03:44:45  Primary Provider Notified of Diagnostic Impression and Management Plan on: 06/09/2023 04:07:11     ----------------------------------------------------------------------------------------------------  Chief Complaint: seizure like activity  History of Present Illness: Patient is a 22 year old Female. Patient is a 22 year old female with 21-week gestational pregnancy, presenting with a chief complaint of three witnessed seizure like activity back-to-back. Patient reports that each seizure involved right gaze deviation and whole body shaking, lasting from a few seconds to almost one minute. It is noted that patient has no prior history of seizures.  Patient returned to baseline after the seizures, with no immediate postictal period. There was no reported tongue biting, urinary, or fecal incontinence during the seizures. Patient has a history of gestational diabetes but denies any other complications with her pregnancy, such as eclampsia or preeclampsia. She is currently on insulin therapy and has no history of high blood pressure, cholesterol, or family history of seizures.  Prior to first episode, patient had just administered dose of insulin.   Medications:  No Anticoagulant use  No Antiplatelet use Reviewed EMR for current medications  Allergies:  Reviewed  Social History: Drug Use: No  Family History:  There is no family history of premature cerebrovascular disease pertinent to this consultation  ROS : 14 Points Review of Systems was performed and was negative except mentioned in HPI.  Past Surgical History: There Is No Surgical History Contributory To Today's Visit   Examination: BP(116/75), Pulse(70), Blood Glucose(77)  Neuro Exam: General: Alert,Awake, Oriented to Time, Place, Person  Speech: Fluent:  Language: Intact:  Face:  Symmetric:  Facial Sensation: Intact:  Visual Fields: Intact:  Extraocular Movements: Intact:  Motor Exam: No Drift:  Sensation: Intact:  Coordination: Intact:  Spoke with : Dr. Arnoldo Morale    This consult was conducted  in real time using interactive audio and video technology. Patient was informed of the technology being used for this visit and agreed to proceed. Patient located in hospital and provider located at home/office setting.  Patient is being evaluated for possible acute neurologic impairment and high probability of imminent or life - threatening deterioration.I spent total of 35 minutes providing care to this patient, including time for face to face visit via telemedicine, review of medical records, imaging studies and discussion of findings with providers, the patient and / or family.   Dr Orson Eva   [facility] For Inpatient follow-up with [facility] physician please call [facility] [phone]. This is not an outpatient service. Post hospital discharge, please contact hospital directly.  Please do not communicate with [facility] physicians via secure chat. If you have any questions, Please contact [facility]. Please call or reconsult our service if there are any clinical or diagnostic changes.

## 2023-06-09 NOTE — OB Triage Note (Signed)

## 2023-06-09 NOTE — Consult Note (Addendum)
NEURO HOSPITALIST CONSULT NOTE   Requesting physician: Dr. Tami Lin  Reason for Consult: LOC with shaking following NPH dose at home last night  History obtained from:   Patient, Fiance and Chart     HPI:                                                                                                                                          Natasha Hayes is a 22 y.o. female who is [redacted] weeks pregnant, with a recent diagnosis of gestational diabetes mellitus, as well as stated diagnosis per fiance of "diabetes when she was a child" who presented to the hospital after she lost consciousness at home with concomitant shaking following SQ NPH insulin administration yesterday night. Per her fiancee, she had come back from work, got ready for bed and then injected 10 units of her nighttime NPH dose that had been prescribed to her by her OB at [facility], after which she rapidly started to feel unwell, became flushed, lay her head down on his chest and then lost consciousness, flex her arms near her chest and then had about 5 seconds of bilateral upper extremity low-amplitude shaking movements, followed by brief return of consciousness. She then lost consciousness again, with another few seconds of similar jerking, woke up for a few seconds a second time, lost consciousness for a third time again with shaking, then regained consciousness without further episodes. EMS was called at the time of the second shaking spell and on their arrival her CBG was in the 60's, per fiance. She was administered OJ and her sugars gradually improved after that per fiance's communication with EMS. She was then brought to the hospital. She has had no further spells. Fiance states that her NPH dose was increased 1-2 days PTA from 10 u to 20 U, but that they decided to take the usual 10 U dose when glucometer reading prior to dosing came back at 116. Fiance and patient are both concerned regarding why she has been prescribed  insulin when, per their own recollection, all of the CBGs they have taken at home have been "normal".   She has no prior history of seizures.   Past Medical History:  Diagnosis Date   ADHD    Diabetes mellitus without complication (HCC)    Insomnia    Mood disorder (HCC)    Renal disorder    kdiney stones    Past Surgical History:  Procedure Laterality Date   NO PAST SURGERIES      Family History  Problem Relation Age of Onset   Cancer Paternal Grandfather        lung   Depression Paternal Grandfather             Social History:  reports that she  has been smoking cigarettes and e-cigarettes. She has a 6.00 pack-year smoking history. She has never used smokeless tobacco. She reports that she does not currently use alcohol after a past usage of about 35.0 standard drinks of alcohol per week. She reports that she does not currently use drugs after having used the following drugs: Marijuana. Frequency: 6.00 times per week.  No Known Allergies  MEDICATIONS:                                                                                                                     Prior to Admission:  Medications Prior to Admission  Medication Sig Dispense Refill Last Dose   insulin NPH Human (NOVOLIN N) 100 UNIT/ML injection Inject 10 Units into the skin at bedtime. Take daily at bedtime (9-10pm)   06/08/2023 at 2230   Prenatal MV & Min w/FA-DHA (PRENATAL ADULT GUMMY/DHA/FA PO) Take 2 tablets by mouth daily at 12 noon.   06/08/2023   diphenhydrAMINE HCl, Sleep, 25 MG TBDP Take 1 tablet (25 mg total) by mouth at bedtime as needed (nausea). (Patient not taking: Reported on 06/09/2023) 30 tablet 0 Not Taking   Doxylamine-Pyridoxine (DICLEGIS) 10-10 MG TBEC Take 1 tablet by mouth at bedtime. (Patient not taking: Reported on 06/09/2023) 60 tablet 0 Not Taking   pantoprazole (PROTONIX) 40 MG tablet Take 1 tablet (40 mg total) by mouth daily. (Patient not taking: Reported on 06/09/2023) 30 tablet 2 Not  Taking   Semaglutide, 1 MG/DOSE, 4 MG/3ML SOPN Inject 1 mg into the skin once a week. (Patient not taking: Reported on 02/18/2023) 3 mL 5 Not Taking   sertraline (ZOLOFT) 50 MG tablet Take 1 tablet (50 mg total) by mouth daily. (Patient not taking: Reported on 02/18/2023) 30 tablet 3 Not Taking   traZODone (DESYREL) 100 MG tablet Take 1 tablet (100 mg total) by mouth at bedtime. (Patient not taking: Reported on 12/13/2021) 30 tablet 0 Not Taking   Scheduled:  docusate sodium  100 mg Oral Daily   prenatal multivitamin  1 tablet Oral Q1200   Continuous:  lactated ringers       ROS:                                                                                                                                       Currently without any symptoms.    Blood pressure 116/60, pulse 79, temperature 97.7 F (  36.5 C), temperature source Oral, resp. rate 20, height 5\' 3"  (1.6 m), weight 93.2 kg, last menstrual period 01/10/2023, SpO2 99 %.   General Examination:                                                                                                       Physical Exam HEENT- [place]/AT   Lungs- Respirations unlabored Extremities- Warm and well-perfused  Neurological Examination Mental Status: Awake and alert. Fully oriented. Thought content appropriate.  Speech fluent without evidence of aphasia.  Able to follow all commands without difficulty. Cranial Nerves: II: Temporal visual fields intact with no extinction to DSS. PERRL. III,IV, VI: No ptosis. EOMI. No nystagmus. V: Temp sensation equal bilaterally VII: Smile symmetric VIII: Hearing intact to voice IX,X: No hypophonia or hoarseness XI: Symmetric XII: Midline tongue extension Motor: RUE: 5/5 LUE: 5/5 RLE: 5/5 LLE: 5/5 Sensory: FT intact x 4. No extinction to DSS. Deep Tendon Reflexes: 2+ and symmetric bilateral biceps, brachioradialis and patellae Cerebellar: No ataxia with FNF bilaterally Gait: Deferred    Lab  Results: Basic Metabolic Panel: Recent Labs  Lab 06/09/23 0130  NA 137  K 3.1*  CL 106  CO2 20*  GLUCOSE 101*  BUN 6  CREATININE 0.49  CALCIUM 8.6*  MG 1.9    CBC: Recent Labs  Lab 06/09/23 0130  WBC 10.4  HGB 12.0  HCT 34.9*  MCV 91.6  PLT 172    Cardiac Enzymes: No results for input(s): "CKTOTAL", "CKMB", "CKMBINDEX", "TROPONINI" in the last 168 hours.  Lipid Panel: No results for input(s): "CHOL", "TRIG", "HDL", "CHOLHDL", "VLDL", "LDLCALC" in the last 168 hours.  Imaging: No results found.   Assessment: 22 y.o. female who is [redacted] weeks pregnant, with a recent diagnosis of gestational diabetes mellitus, as well as stated diagnosis per fiance of "diabetes when she was a child" who presented to the hospital after she lost consciousness at home with concomitant shaking following SQ NPH insulin administration yesterday night.  - Exam is nonfocal - Overall history is felt to be most consistent with LOC and tremoring secondary to severe transient hypoglycemia following administration of NPH insulin at home. Semiology of the 3 back to back short-duration spells at home does not militate in favor of a primary seizure disorder.   Recommendations: - EEG (ordered) - Endocrinology consult to determine if changes need to be made to her home glycemic control regimen in the setting of her outside diagnosis of gestational diabetes mellitus - The patient has been advised that, from a neurological standpoint, severe hypoglycemia can have serious adverse consequences including diabetic coma, seizures and death.  - She has had diabetic education here but this education may need to be repeated as she is having difficulty taking readings from the newly placed transdermal glucose monitor and states that she is unsure of what changes if any to her glucose control regimen have occurred due to her work up here  Addendum: - EEG: This study is within normal limits. No seizures or epileptiform  discharges were seen throughout the recording.  - No  indication for initiation of an anticonvulsant at this time - Would not discharge without obtaining an endocrinology consult   Electronically signed: Dr. Caryl Pina 06/09/2023, 9:37 AM

## 2023-06-09 NOTE — Procedures (Signed)
Patient Name: Areona Homer  MRN: [id number]  Epilepsy Attending: Charlsie Quest  Referring Physician/Provider: Caryl Pina, MD  Date: 06/09/2023 Duration: 30.17 mins  Patient history: 21yo F with seizure like activity getting eeg to evaluate for seizure.  Level of alertness: Awake, asleep  AEDs during EEG study: None  Technical aspects: This EEG study was done with scalp electrodes positioned according to the 10-20 International system of electrode placement. Electrical activity was reviewed with band pass filter of 1-70Hz , sensitivity of 7 uV/mm, display speed of 76mm/sec with a 60Hz  notched filter applied as appropriate. EEG data were recorded continuously and digitally stored.  Video monitoring was available and reviewed as appropriate.  Description: The posterior dominant rhythm consists of 9 Hz activity of moderate voltage (25-35 uV) seen predominantly in posterior head regions, symmetric and reactive to eye opening and eye closing. Sleep was characterized by vertex waves, sleep spindles (12 to 14 Hz), maximal frontocentral region. Hyperventilation and photic stimulation were not performed.     IMPRESSION: This study is within normal limits. No seizures or epileptiform discharges were seen throughout the recording.  A normal interictal EEG does not exclude the diagnosis of epilepsy.   Timisha Mondry Annabelle Harman

## 2023-06-09 NOTE — ED Triage Notes (Signed)
Patient arrived via EMS from home. State patient is approximately 6 months pregnant and has recently started insulin. Patient states she took 10 units of humulin and stated approximately 10 minutes later she became dizzy and lost consciousness. EMS state patient's family reported patient had seizure activity. Patient AOX4 on arrival to ED speaking in full clear sentences. Resp even, unlabored on RA. Reports normal fetal activity. Denies falling. Denies hitting head. Denies headache or change of vision.

## 2023-06-09 NOTE — TOC Benefit Eligibility Note (Signed)
Patient Product/process development scientist completed.    The patient is currently admitted and upon discharge could be taking Freestyle Libre 3 Sensor .  Requires Prior Authorization  The patient is insured through [facility] Medicaid   This test claim was processed through [facility]- copay amounts may vary at other pharmacies due to pharmacy/plan contracts, or as the patient moves through the different stages of their insurance plan.  Roland Earl, CPHT Pharmacy Patient Advocate Specialist [facility] Patient Advocate Team Direct Number: [phone]  Fax: [phone]

## 2023-07-16 LAB — PANORAMA PRENATAL TEST FULL PANEL:PANORAMA TEST PLUS 5 ADDITIONAL MICRODELETIONS: FETAL FRACTION: 9.8

## 2023-09-21 ENCOUNTER — Emergency Department
Admission: EM | Admit: 2023-09-21 | Discharge: 2023-09-21 | Discharge status: Against Medical Advice | Payer: MEDICAID | Attending: Emergency Medicine | Admitting: Emergency Medicine

## 2023-09-21 ENCOUNTER — Other Ambulatory Visit: Payer: Self-pay

## 2023-09-21 DIAGNOSIS — Z5321 Procedure and treatment not carried out due to patient leaving prior to being seen by health care provider: Secondary | ICD-10-CM | POA: Diagnosis not present

## 2023-09-21 DIAGNOSIS — R109 Unspecified abdominal pain: Secondary | ICD-10-CM | POA: Insufficient documentation

## 2023-09-21 DIAGNOSIS — R300 Dysuria: Secondary | ICD-10-CM | POA: Insufficient documentation

## 2023-09-21 LAB — COMPREHENSIVE METABOLIC PANEL
ALT: 25 U/L (ref 0–44)
AST: 24 U/L (ref 15–41)
Albumin: 4 g/dL (ref 3.5–5.0)
Alkaline Phosphatase: 80 U/L (ref 38–126)
Anion gap: 11 (ref 5–15)
BUN: 15 mg/dL (ref 6–20)
CO2: 22 mmol/L (ref 22–32)
Calcium: 9.5 mg/dL (ref 8.9–10.3)
Chloride: 104 mmol/L (ref 98–111)
Creatinine, Ser: 0.61 mg/dL (ref 0.44–1.00)
GFR, Estimated: >60 mL/min (ref >60)
Glucose, Bld: 94 mg/dL (ref 70–99)
Potassium: 4 mmol/L (ref 3.5–5.1)
Sodium: 137 mmol/L (ref 135–145)
Total Bilirubin: 0.5 mg/dL (ref 0.3–1.2)
Total Protein: 7.5 g/dL (ref 6.5–8.1)

## 2023-09-21 LAB — URINALYSIS, ROUTINE W REFLEX MICROSCOPIC
Bilirubin Urine: NEGATIVE
Glucose, UA: NEGATIVE mg/dL
Ketones, ur: NEGATIVE mg/dL
Nitrite: NEGATIVE
Protein, ur: 100 mg/dL — AB
RBC / HPF: >50 RBC/hpf (ref 0–5)
Specific Gravity, Urine: 1.013 (ref 1.005–1.030)
WBC, UA: >50 WBC/hpf (ref 0–5)
pH: 6 (ref 5.0–8.0)

## 2023-09-21 LAB — CBC
HCT: 37.1 % (ref 36.0–46.0)
Hemoglobin: 12.4 g/dL (ref 12.0–15.0)
MCH: 29.6 pg (ref 26.0–34.0)
MCHC: 33.4 g/dL (ref 30.0–36.0)
MCV: 88.5 fL (ref 80.0–100.0)
Platelets: 284 10*3/uL (ref 150–400)
RBC: 4.19 MIL/uL (ref 3.87–5.11)
RDW: 12.5 % (ref 11.5–15.5)
WBC: 11.4 10*3/uL — ABNORMAL HIGH (ref 4.0–10.5)
nRBC: 0 % (ref 0.0–0.2)

## 2023-09-21 LAB — LIPASE, BLOOD: Lipase: 23 U/L (ref 11–51)

## 2023-09-21 NOTE — ED Triage Notes (Signed)
Pt to ED for left sided abd pain. Reports had baby in September. Had IUD placed when had baby. Denies n/v/d. Reports pain with urination.

## 2024-01-22 ENCOUNTER — Ambulatory Visit (INDEPENDENT_AMBULATORY_CARE_PROVIDER_SITE_OTHER): Payer: MEDICAID

## 2024-01-22 VITALS — BP 117/79 | HR 99 | Ht 63.0 in | Wt 193.0 lb

## 2024-01-22 DIAGNOSIS — Z30017 Encounter for initial prescription of implantable subdermal contraceptive: Secondary | ICD-10-CM

## 2024-01-22 DIAGNOSIS — Z3202 Encounter for pregnancy test, result negative: Secondary | ICD-10-CM | POA: Diagnosis not present

## 2024-01-22 DIAGNOSIS — Z3009 Encounter for other general counseling and advice on contraception: Secondary | ICD-10-CM

## 2024-01-22 LAB — POCT URINE PREGNANCY: Preg Test, Ur: NEGATIVE

## 2024-01-22 MED ORDER — ETONOGESTREL 68 MG ~~LOC~~ IMPL
68.0000 mg | DRUG_IMPLANT | Freq: Once | SUBCUTANEOUS | Status: AC
  Administered 2024-01-22: 68 mg via SUBCUTANEOUS

## 2024-01-22 NOTE — Progress Notes (Signed)
    ENCOUNTER FOR NEXPLANON INSERTION  SUBJECTIVE Natasha Hayes is a 23 y.o. G1P0000 who presents today for insertion of Nexplanon contraceptive device. She desires reversible long-term contraception. We have thoroughly reviewed the risks, benefits, and alternatives, and she has elected to proceed with Nexplanon insertion.   OBJECTIVE BP 117/79   Pulse 99   Ht 5\' 3"  (1.6 m)   Wt 193 lb (87.5 kg)   Breastfeeding No   BMI 34.19 kg/m   UPT: Negative  Procedure Note Left Arm Sterile Preparation:  Betadine Insertion site was selected (8-10) cm from the medial epicondyle and marked using a sterile marker. The procedure area was prepped in sterile fashion. Adequate anesthesia was achieved with 2 mL of 1% lidocaine injected subcutaneously. The Nexplanon applicator was inserted subcutaneously and the Nexplanon device was delivered. The applicator was removed from the insertion site. The capsule was palpated by myself and the patient to confirm satisfactory placement. Blood loss was minimal. A pressure dressing was applied.  The patient tolerated the procedure well with no complications. Standard post-procedure care and return precautions were explained.   Lindalou Hose Kyson Kupper, CNM

## 2024-07-24 ENCOUNTER — Emergency Department: Payer: MEDICAID

## 2024-07-24 ENCOUNTER — Other Ambulatory Visit: Payer: Self-pay

## 2024-07-24 ENCOUNTER — Emergency Department
Admission: EM | Admit: 2024-07-24 | Discharge: 2024-07-24 | Disposition: A | Discharge status: Home / Self Care | Payer: MEDICAID | Attending: Emergency Medicine | Admitting: Emergency Medicine

## 2024-07-24 DIAGNOSIS — R1011 Right upper quadrant pain: Secondary | ICD-10-CM | POA: Diagnosis present

## 2024-07-24 DIAGNOSIS — R0789 Other chest pain: Secondary | ICD-10-CM

## 2024-07-24 DIAGNOSIS — B9689 Other specified bacterial agents as the cause of diseases classified elsewhere: Secondary | ICD-10-CM

## 2024-07-24 LAB — COMPREHENSIVE METABOLIC PANEL WITH GFR
ALT: 36 U/L (ref 0–44)
AST: 24 U/L (ref 15–41)
Albumin: 4.3 g/dL (ref 3.5–5.0)
Alkaline Phosphatase: 53 U/L (ref 38–126)
Anion gap: 12 (ref 5–15)
BUN: 14 mg/dL (ref 6–20)
CO2: 20 mmol/L — ABNORMAL LOW (ref 22–32)
Calcium: 9.8 mg/dL (ref 8.9–10.3)
Chloride: 106 mmol/L (ref 98–111)
Creatinine, Ser: 0.79 mg/dL (ref 0.44–1.00)
GFR, Estimated: >60 mL/min (ref >60)
Glucose, Bld: 92 mg/dL (ref 70–99)
Potassium: 3.9 mmol/L (ref 3.5–5.1)
Sodium: 138 mmol/L (ref 135–145)
Total Bilirubin: 0.6 mg/dL (ref 0.0–1.2)
Total Protein: 7.3 g/dL (ref 6.5–8.1)

## 2024-07-24 LAB — CBC
HCT: 40.4 % (ref 36.0–46.0)
Hemoglobin: 13.7 g/dL (ref 12.0–15.0)
MCH: 29 pg (ref 26.0–34.0)
MCHC: 33.9 g/dL (ref 30.0–36.0)
MCV: 85.6 fL (ref 80.0–100.0)
Platelets: 238 K/uL (ref 150–400)
RBC: 4.72 MIL/uL (ref 3.87–5.11)
RDW: 13.1 % (ref 11.5–15.5)
WBC: 7.3 K/uL (ref 4.0–10.5)
nRBC: 0 % (ref 0.0–0.2)

## 2024-07-24 LAB — URINALYSIS, ROUTINE W REFLEX MICROSCOPIC
Bilirubin Urine: NEGATIVE
Glucose, UA: NEGATIVE mg/dL
Hgb urine dipstick: NEGATIVE
Ketones, ur: NEGATIVE mg/dL
Nitrite: NEGATIVE
Protein, ur: NEGATIVE mg/dL
Specific Gravity, Urine: 1.019 (ref 1.005–1.030)
pH: 5 (ref 5.0–8.0)

## 2024-07-24 LAB — CHLAMYDIA/NGC RT PCR (ARMC ONLY)
Chlamydia Tr: NOT DETECTED
N gonorrhoeae: NOT DETECTED

## 2024-07-24 LAB — WET PREP, GENITAL
Sperm: NONE SEEN
Trich, Wet Prep: NONE SEEN
WBC, Wet Prep HPF POC: <10 (ref <10)
Yeast Wet Prep HPF POC: NONE SEEN

## 2024-07-24 LAB — HCG, QUANTITATIVE, PREGNANCY: hCG, Beta Chain, Quant, S: <1 m[IU]/mL (ref <5)

## 2024-07-24 LAB — LIPASE, BLOOD: Lipase: 34 U/L (ref 11–51)

## 2024-07-24 MED ORDER — DICYCLOMINE HCL 20 MG PO TABS: 20.0000 mg | ORAL_TABLET | Freq: Four times a day (QID) | ORAL | 0 refills | Status: AC | PRN

## 2024-07-24 MED ORDER — FENTANYL CITRATE PF 50 MCG/ML IJ SOSY
50.0000 ug | PREFILLED_SYRINGE | Freq: Once | INTRAMUSCULAR | Status: AC
  Administered 2024-07-24: 50 ug via INTRAVENOUS
  Filled 2024-07-24: qty 1

## 2024-07-24 MED ORDER — NAPROXEN 500 MG PO TABS: 500.0000 mg | ORAL_TABLET | Freq: Two times a day (BID) | ORAL | 0 refills | Status: AC

## 2024-07-24 MED ORDER — LIDOCAINE 5 % EX PTCH
1.0000 | MEDICATED_PATCH | Freq: Once | CUTANEOUS | Status: DC
  Administered 2024-07-24: 1 via TRANSDERMAL
  Filled 2024-07-24: qty 1

## 2024-07-24 MED ORDER — HYDROMORPHONE HCL 1 MG/ML IJ SOLN
1.0000 mg | INTRAMUSCULAR | Status: AC
  Administered 2024-07-24: 1 mg via INTRAVENOUS
  Filled 2024-07-24: qty 1

## 2024-07-24 MED ORDER — IOHEXOL 350 MG/ML SOLN
100.0000 mL | Freq: Once | INTRAVENOUS | Status: AC | PRN
  Administered 2024-07-24: 100 mL via INTRAVENOUS

## 2024-07-24 MED ORDER — KETOROLAC TROMETHAMINE 15 MG/ML IJ SOLN
15.0000 mg | Freq: Once | INTRAMUSCULAR | Status: AC
  Administered 2024-07-24: 15 mg via INTRAVENOUS
  Filled 2024-07-24: qty 1

## 2024-07-24 MED ORDER — HYDROMORPHONE HCL 1 MG/ML IJ SOLN
0.5000 mg | INTRAMUSCULAR | Status: AC
  Administered 2024-07-24: 0.5 mg via INTRAVENOUS
  Filled 2024-07-24: qty 0.5

## 2024-07-24 MED ORDER — METRONIDAZOLE 500 MG PO TABS: 500.0000 mg | ORAL_TABLET | Freq: Three times a day (TID) | ORAL | 0 refills | Status: AC

## 2024-07-24 MED ORDER — HYDROCODONE-ACETAMINOPHEN 5-325 MG PO TABS
1.0000 | ORAL_TABLET | Freq: Once | ORAL | Status: AC
  Administered 2024-07-24: 1 via ORAL
  Filled 2024-07-24: qty 1

## 2024-07-24 MED ORDER — KETOROLAC TROMETHAMINE 30 MG/ML IJ SOLN
15.0000 mg | Freq: Once | INTRAMUSCULAR | Status: AC
  Administered 2024-07-24: 15 mg via INTRAVENOUS
  Filled 2024-07-24: qty 1

## 2024-07-24 MED ORDER — ONDANSETRON HCL 4 MG/2ML IJ SOLN
4.0000 mg | INTRAMUSCULAR | Status: AC
  Administered 2024-07-24: 4 mg via INTRAVENOUS
  Filled 2024-07-24: qty 2

## 2024-07-24 NOTE — Discharge Instructions (Addendum)
 Your testing today fortunately did not show an emergency cause for your pain.  I have sent a prescription for some pain medication to your pharmacy.  I have also sent a prescription for antibiotics.  Follow-up with a primary care doctor for further evaluation of your symptoms.  Return to the ER for new or worsening symptoms.

## 2024-07-24 NOTE — ED Notes (Signed)
 Pt transported to ultrasound.

## 2024-07-24 NOTE — ED Provider Notes (Signed)
 Care of this patient assumed from prior physician at 1600 pending CT, additional workup as indicated, disposition. Please see prior physician note for further details.  Briefly, this is a 23 year old female presenting with right upper quadrant abdominal pain for several months worsened over the past 2 days.  Ultrasound without acute findings.  Labs reassuring.  Urinalysis overall not suggestive of infection.  Pelvic exam performed with significant discomfort throughout exam, difficult to assess for true CMT.  Wet prep with clue cells present, GC negative.  hCG negative.  Signed out to me pending CTA of the chest and CT abdomen pelvis.  If these were negative, plan for ovarian ultrasound for further evaluation.  If no other cause identified, plan to empirically treat for PID.  CT of the chest without PE or other acute finding. CT abdomen pelvis without acute finding, redemonstrates fatty liver noted on ultrasound.  Transvaginal ultrasound ordered to further evaluate for ovarian pathology.  US  resulted without evidence of torsion or other acute abnormality. Patient reassessed. Tells me that she has been having similar pain over the last year with unknown cause. On my exam, patient has focal area of point tenderness along the right anterior chest wall without overlying skin changes.  Her abdomen is not significantly tender to exam.  Discussed antibiotics for BV.  GC here has resulted negative, I did discuss treatment for PID though lower suspicion and patient agreeable to just trial BV treatment.  Discussed importance of outpatient follow-up.  Strict return precautions provided.  Patient discharged in stable condition.   Levander Slate, MD 07/24/24 [phone]

## 2024-07-24 NOTE — ED Triage Notes (Signed)
 Pt to ED via [facility] from home for c/o RUQ pain that radiates to back x 2 days. Pt endorses n/v/d. Pt A&O, crying in pain.

## 2024-07-24 NOTE — ED Provider Notes (Signed)
 [facility] Provider Note    Event Date/Time   First MD Initiated Contact with Patient 07/24/24 1335     (approximate)   History   Abdominal Pain   HPI  Natasha Hayes is a 23 y.o. female reports severe right upper quadrant abdominal pain for 2 days.  She has had this pain intermittently for months.  She reports she saw a doctor once, not sure the exact date but up in [place] .  She was told that it was not something that needed immediate surgery.  She has a history of previous pregnancy.  No fevers or chill  Patient reports a severe 10 out of 10 right upper quadrant pain.  Worse in quite a bit after eating cookout last night.  Nausea but no vomiting  No chest pain or shortness of breath no pain in other areas the abdomen.  Does not use any alcohol.  No known history of liver or gallbladder disease per patient  Denies pregnancy.  No lower abdominal pain.  No vaginal bleeding or vaginal symptoms     Physical Exam   Triage Vital Signs: ED Triage Vitals  Encounter Vitals Group     BP      Girls Systolic BP Percentile      Girls Diastolic BP Percentile      Boys Systolic BP Percentile      Boys Diastolic BP Percentile      Pulse      Resp      Temp      Temp src      SpO2      Weight      Height      Head Circumference      Peak Flow      Pain Score      Pain Loc      Pain Education      Exclude from Growth Chart     Most recent vital signs: Vitals:   07/24/24 1334 07/24/24 1530  BP: (!) 128/94 114/70  Pulse: 76 (!) 47  Resp: 20 18  Temp: 98.2 F (36.8 C)   SpO2: 100% 95%     General: Awake, appears in painful distress reporting a severe right upper quadrant abdominal pain. CV:  Good peripheral perfusion.  Normal tones and rate. Resp:  Normal effort.  Lungs are clear without obvious pleuritic component Abd:  No distention.  Remarkably tender severely tender focally in the right upper quadrant.  Unable to perform [facility]  test as she reports the pain is so severe that she can barely touch her right upper quadrant.  Able to palpate left abdomen left upper left lower and right lower in these areas are nontender.  Not obviously gravid Other:  Warm well-perfused.   ED Results / Procedures / Treatments   Labs (all labs ordered are listed, but only abnormal results are displayed) Labs Reviewed  WET PREP, GENITAL - Abnormal; Notable for the following components:      Result Value   Clue Cells Wet Prep HPF POC PRESENT (*)    All other components within normal limits  COMPREHENSIVE METABOLIC PANEL WITH GFR - Abnormal; Notable for the following components:   CO2 20 (*)    All other components within normal limits  URINALYSIS, ROUTINE W REFLEX MICROSCOPIC - Abnormal; Notable for the following components:   Color, Urine YELLOW (*)    APPearance HAZY (*)    Leukocytes,Ua SMALL (*)    Bacteria, UA  RARE (*)    All other components within normal limits  CHLAMYDIA/NGC RT PCR ([facility] ONLY)            CBC  LIPASE, BLOOD  HCG, QUANTITATIVE, PREGNANCY     RADIOLOGY  US  ABDOMEN LIMITED RUQ (LIVER/GB) Result Date: 07/24/2024 CLINICAL DATA:  Abdominal pain. EXAM: ULTRASOUND ABDOMEN LIMITED RIGHT UPPER QUADRANT COMPARISON:  Ultrasound dated 02/12/2021. FINDINGS: Gallbladder: No gallstones or wall thickening visualized. No sonographic Murphy sign noted by sonographer. Common bile duct: Diameter: 6 mm Liver: There is diffuse increased liver echogenicity most commonly seen in the setting of fatty infiltration. Superimposed inflammation or fibrosis is not excluded. Clinical correlation is recommended. Portal vein is patent on color Doppler imaging with normal direction of blood flow towards the liver. Other: None. IMPRESSION: Fatty liver, otherwise unremarkable right upper quadrant ultrasound. Electronically Signed   By: Vanetta Chou M.D.   On: 07/24/2024 15:12   Ultrasound interpreted by me as negative for obvious gallbladder  disease   PROCEDURES:  Critical Care performed: No  Procedures   MEDICATIONS ORDERED IN ED: Medications  HYDROmorphone  (DILAUDID ) injection 1 mg (1 mg Intravenous Given 07/24/24 1345)  ondansetron  (ZOFRAN ) injection 4 mg (4 mg Intravenous Given 07/24/24 1353)  ketorolac  (TORADOL ) 30 MG/ML injection 15 mg (15 mg Intravenous Given 07/24/24 1450)  HYDROmorphone  (DILAUDID ) injection 0.5 mg (0.5 mg Intravenous Given 07/24/24 1450)  HYDROmorphone  (DILAUDID ) injection 1 mg (1 mg Intravenous Given 07/24/24 1525)     IMPRESSION / MDM / ASSESSMENT AND PLAN / ED COURSE  I reviewed the triage vital signs and the nursing notes.                             Differential diagnosis includes but is not limited to, abdominal perforation, aortic dissection, cholecystitis, appendicitis, diverticulitis, colitis, esophagitis/gastritis, kidney stone, pyelonephritis, urinary tract infection, aortic aneurysm. All are considered in decision and treatment plan. Based upon the patient's presentation and risk factors, and negative pregnancy status we will proceed by obtaining right upper quadrant ultrasound as this is where she reports focality of pain.  Her lab tests including LFTs lipase CBC are unremarkable.  Urinalysis demonstrates small leukocytes and rare bacteria but slight contamination of the sample and without associated obvious flank pain or dysuria I doubt acute UTI as it does not align with her symptoms well.  No associated lower abdominal pain or pelvic discomfort reported by the patient   Patient's presentation is most consistent with acute complicated illness / injury requiring diagnostic workup.    The patient is on the cardiac monitor to evaluate for evidence of arrhythmia and/or significant heart rate changes.   Clinical Course as of 07/24/24 1553  Sun Jul 24, 2024  1550 Pelvic exam performed by PA student Ellie with my oversight.  Patient reports significant pain with laying flat she does  however report it to be right upper abdominal and pleuritic in nature now.  She appears quite uncomfortable is difficult for her to tolerate laying flat for pelvic exam.  Additional medication ordered for pain, she was able to undergo pelvic exam and had GC and wet prep performed.  She relates discomfort during exam and some pain with cervical motion but also relates the pain to be right upper quadrant pleuritic primarily now worsened by deep respirations.  Fairly atypical of gynecologic symptoms.  She also relates earlier she having no associated lower abdominal pain.  At this juncture we will obtain CT  abdomen pelvis including PE study due to the pleuritic nature and further need for evaluation of right upper quadrant pathology.  If CT imaging is negative for obvious pathology, consideration for transvaginal ultrasound is warranted.  Ongoing care assigned to Dr. Levander to follow-up on CT potential pelvic ultrasound and if no other source of pain is elucidated consideration of possible Ferrel Brasil and treatment for PID is considered though felt to be less likely pending other studies at this time [MQ]    Clinical Course User Index [MQ] Dicky Anes, MD  ----------------------------------------- 2:47 PM on 07/24/2024 ----------------------------------------- Pregnancy test negative renal function normal.  Toradol  and additional hydromorphone  ordered.  Patient fully awake and alert but continues to have ongoing pain though is starting to improve.  Ordering CT abdomen pelvis at this time   FINAL CLINICAL IMPRESSION(S) / ED DIAGNOSES   Final diagnoses:  RUQ pain     Rx / DC Orders   ED Discharge Orders     None        Note:  This document was prepared using Dragon voice recognition software and may include unintentional dictation errors.   Dicky Anes, MD 07/24/24 [phone]

## 2024-08-24 ENCOUNTER — Ambulatory Visit: Payer: MEDICAID

## 2024-08-24 NOTE — Progress Notes (Deleted)
 New patient visit   Patient: Natasha Hayes   DOB: 2001/10/31   22 y.o. Female  MRN: [id number] Visit Date: 08/24/2024  Today's healthcare provider: Isaiah DELENA Pepper, MD   No chief complaint on file.  Subjective    Natasha Hayes is a 23 y.o. female who presents today as a new patient to establish care.    - hx of gestational diabetes, required insulin during pregnancy    Past Medical History:  Diagnosis Date   ADHD    Diabetes mellitus without complication (HCC)    Insomnia    Mood disorder (HCC)    Renal disorder    kdiney stones   Past Surgical History:  Procedure Laterality Date   NO PAST SURGERIES     Family Status  Relation Name Status   PGF  Deceased  No partnership data on file   Family History  Problem Relation Age of Onset   Cancer Paternal Grandfather        lung   Depression Paternal Grandfather    Social History   Socioeconomic History   Marital status: Single    Spouse name: Not on file   Number of children: Not on file   Years of education: Not on file   Highest education level: Not on file  Occupational History   Not on file  Tobacco Use   Smoking status: Every Day    Current packs/day: 0.00    Average packs/day: 1 pack/day for 6.0 years (6.0 ttl pk-yrs)    Types: Cigarettes, E-cigarettes    Start date: 08/30/2015    Last attempt to quit: 08/29/2021    Years since quitting: 2.9   Smokeless tobacco: Never   Tobacco comments:    Quit line card provided.  Smoking Cessation and pregnancy booklet given  Vaping Use   Vaping status: Every Day   Substances: Nicotine , Flavoring  Substance and Sexual Activity   Alcohol use: Not Currently    Alcohol/week: 35.0 standard drinks of alcohol    Types: 35 Shots of liquor per week    Comment: drinks scotch, several drinks throughout the day, 5 per day is estimation, Client reports that 2 years ago she was drinking, and was hospitalized.  Reports not currently drinking   Drug use: Not Currently     Frequency: 6.0 times per week    Types: Marijuana    Comment: uses daily at least once or more per day   Sexual activity: Yes    Birth control/protection: None    Comment: last time used the IUD, and was removed a while ago, it more than one since on Birth Control  Other Topics Concern   Not on file  Social History Narrative   Not on file   Social Drivers of Health   Financial Resource Strain: Low Risk  (09/02/2023)   Received from [facility]   Overall Financial Resource Strain (CARDIA)    Difficulty of Paying Living Expenses: Not very hard  Food Insecurity: No Food Insecurity (09/02/2023)   Received from [facility]   Hunger Vital Sign    Within the past 12 months, you worried that your food would run out before you got the money to buy more.: Never true    Within the past 12 months, the food you bought just didn't last and you didn't have money to get more.: Never true  Transportation Needs: No Transportation Needs (09/02/2023)   Received from [facility] - Transportation  Lack of Transportation (Medical): No    Lack of Transportation (Non-Medical): No  Physical Activity: Inactive (05/15/2021)   Exercise Vital Sign    Days of Exercise per Week: 0 days    Minutes of Exercise per Session: 0 min  Stress: Stress Concern Present (05/15/2021)   [employer] of Occupational Health - Occupational Stress Questionnaire    Feeling of Stress : Very much  Social Connections: Socially Isolated (05/15/2021)   Social Connection and Isolation Panel    Frequency of Communication with Friends and Family: Never    Frequency of Social Gatherings with Friends and Family: Never    Attends Religious Services: Never    Database administrator or Organizations: No    Attends Banker Meetings: Never    Marital Status: Never married   Outpatient Medications Prior to Visit  Medication Sig   Continuous Glucose Sensor (FREESTYLE LIBRE 3 SENSOR) MISC 1 Application by  Does not apply route as directed. Place 1 sensor on the skin every 14 days. Use to check glucose continuously (Patient not taking: Reported on 01/22/2024)   dicyclomine  (BENTYL ) 20 MG tablet Take 1 tablet (20 mg total) by mouth every 6 (six) hours as needed for up to 7 days for spasms.   Glucagon  (BAQSIMI  TWO PACK) 3 MG/DOSE POWD Place 1 spray into the nose as directed. For severe hypoglycemia. (Patient not taking: Reported on 01/22/2024)   insulin NPH Human (NOVOLIN N) 100 UNIT/ML injection Inject 10 Units into the skin at bedtime. Take daily at bedtime (9-10pm) (Patient not taking: Reported on 01/22/2024)   Prenatal MV & Min w/FA-DHA (PRENATAL ADULT GUMMY/DHA/FA PO) Take 2 tablets by mouth daily at 12 noon. (Patient not taking: Reported on 01/22/2024)   No facility-administered medications prior to visit.   No Known Allergies  Reviews of Systems as noted in HPI.  {Insert previous labs (optional):23779} {See past labs  Heme  Chem  Endocrine  Serology  Results Review (optional):1}   Objective    There were no vitals taken for this visit. {Insert last BP/Wt (optional):23777}{See vitals history (optional):1}   Physical Exam  Depression Screen    02/18/2023   11:16 AM 02/19/2022    8:28 AM 12/13/2021    9:35 AM 10/02/2021    1:28 PM  PHQ 2/9 Scores  PHQ - 2 Score 0 0 2 0  PHQ- 9 Score   14    No results found for any visits on 08/24/24.  Assessment & Plan      Problem List Items Addressed This Visit   None   Assessment and Plan              No follow-ups on file.      Isaiah DELENA Pepper, MD  [facility] [phone] (phone) [phone] (fax)

## 2024-09-02 ENCOUNTER — Emergency Department
Admission: EM | Admit: 2024-09-02 | Discharge: 2024-09-02 | Disposition: A | Discharge status: Home / Self Care | Payer: MEDICAID | Attending: Emergency Medicine | Admitting: Emergency Medicine

## 2024-09-02 ENCOUNTER — Emergency Department: Payer: MEDICAID

## 2024-09-02 ENCOUNTER — Other Ambulatory Visit: Payer: Self-pay

## 2024-09-02 DIAGNOSIS — E119 Type 2 diabetes mellitus without complications: Secondary | ICD-10-CM | POA: Diagnosis not present

## 2024-09-02 DIAGNOSIS — M79672 Pain in left foot: Secondary | ICD-10-CM | POA: Insufficient documentation

## 2024-09-02 MED ORDER — OXYCODONE-ACETAMINOPHEN 5-325 MG PO TABS
1.0000 | ORAL_TABLET | Freq: Once | ORAL | Status: AC
  Administered 2024-09-02: 1 via ORAL
  Filled 2024-09-02: qty 1

## 2024-09-02 NOTE — Discharge Instructions (Signed)
 Please buddy tape toes for the next few days. Wear the postop shoe until pain has improved. Return to the ER for symptoms of concern.
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
## 2025-01-20 NOTE — Progress Notes (Signed)
 error
